# Patient Record
Sex: Male | Born: 1965 | Race: Black or African American | Hispanic: No | Marital: Married | Smoking: Former smoker
Health system: Southern US, Community
[De-identification: ages and names within clinical notes are randomized; demographics above are authoritative.]

## PROBLEM LIST (undated history)

## (undated) DIAGNOSIS — C801 Malignant (primary) neoplasm, unspecified: Secondary | ICD-10-CM

## (undated) DIAGNOSIS — A048 Other specified bacterial intestinal infections: Secondary | ICD-10-CM

## (undated) DIAGNOSIS — G709 Myoneural disorder, unspecified: Secondary | ICD-10-CM

## (undated) DIAGNOSIS — K219 Gastro-esophageal reflux disease without esophagitis: Secondary | ICD-10-CM

## (undated) DIAGNOSIS — E049 Nontoxic goiter, unspecified: Secondary | ICD-10-CM

## (undated) DIAGNOSIS — E785 Hyperlipidemia, unspecified: Secondary | ICD-10-CM

## (undated) DIAGNOSIS — R51 Headache: Secondary | ICD-10-CM

## (undated) HISTORY — DX: Gastro-esophageal reflux disease without esophagitis: K21.9

## (undated) HISTORY — DX: Hyperlipidemia, unspecified: E78.5

## (undated) HISTORY — DX: Nontoxic goiter, unspecified: E04.9

## (undated) HISTORY — DX: Other specified bacterial intestinal infections: A04.8

## (undated) HISTORY — PX: TUMOR REMOVAL: SHX12

## (undated) HISTORY — PX: GANGLION CYST EXCISION: SHX1691

## (undated) HISTORY — DX: Myoneural disorder, unspecified: G70.9

---

## 1999-01-07 ENCOUNTER — Ambulatory Visit (HOSPITAL_COMMUNITY): Admission: RE | Admit: 1999-01-07 | Discharge: 1999-01-07 | Payer: Self-pay | Admitting: General Surgery

## 1999-02-12 ENCOUNTER — Ambulatory Visit (HOSPITAL_COMMUNITY): Admission: RE | Admit: 1999-02-12 | Discharge: 1999-02-12 | Payer: Self-pay | Admitting: General Surgery

## 1999-02-12 ENCOUNTER — Emergency Department (HOSPITAL_COMMUNITY): Admission: EM | Admit: 1999-02-12 | Discharge: 1999-02-12 | Payer: Self-pay | Admitting: Emergency Medicine

## 1999-07-29 HISTORY — PX: HERNIA REPAIR: SHX51

## 2005-03-09 ENCOUNTER — Inpatient Hospital Stay (HOSPITAL_COMMUNITY): Admission: EM | Admit: 2005-03-09 | Discharge: 2005-03-11 | Payer: Self-pay | Admitting: Emergency Medicine

## 2005-03-09 ENCOUNTER — Ambulatory Visit: Payer: Self-pay | Admitting: Internal Medicine

## 2008-04-24 ENCOUNTER — Encounter (HOSPITAL_COMMUNITY): Admission: RE | Admit: 2008-04-24 | Discharge: 2008-07-05 | Payer: Self-pay | Admitting: Internal Medicine

## 2008-06-16 ENCOUNTER — Encounter: Admission: RE | Admit: 2008-06-16 | Discharge: 2008-06-16 | Payer: Self-pay | Admitting: Neurological Surgery

## 2010-12-13 NOTE — Discharge Summary (Signed)
NAMEJORIEL, STREETY NO.:  [id number]   MEDICAL RECORD NO.:  [id number]          PATIENT TYPE:  INP   LOCATION:  5709                         FACILITY:  [facility]   PHYSICIAN:  Ileana Roup, M.D.  DATE OF BIRTH:  September 11, 1965   DATE OF ADMISSION:  03/09/2005  DATE OF DISCHARGE:  03/11/2005                                 DISCHARGE SUMMARY   DISCHARGE DIAGNOSES:  1.  Hypotension.  2.  Fevers, chills, elevated white count.  3.  Weight loss and lymphadenopathy.  4.  Hematuria.  5.  Hypokalemia.  6.  Sinusitis.  7.  History of malaria 10 years ago, treated.  8.  Right inguinal hernia repair in 2000.   DISCHARGE MEDICATIONS:  1.  Doxycycline 100 mg b.i.d., stop on March 18, 2005.  2.  Claritin-D over the counter as needed.   CONDITION ON DISCHARGE:  Fair.   FOLLOWUP INSTRUCTIONS:  The patient has a followup appointment scheduled  with Dr. Orma Flaming at the [facility] and we will call him  with an appointment.   Other discharge instructions include for the patient to take his temperature  3 times a day and if above 101, call the outpatient clinic.  He is also  instructed to drink lots of fluids and to rest until his followup clinic  appointment.  He was given a note to excuse him from work until at least  March 17, 2005.   PROCEDURES:  The patient had a chest x-ray on March 09, 2005 that showed no  active cardiopulmonary disease.   He had a CT of the abdomen and pelvis on March 09, 2005 which showed no  abnormalities, a negative CT of both the abdomen and pelvis, no evidence of  any kidney stones.   He had a CT of the sinuses on March 10, 2005 that showed mucoperiosteal  thickening and polyps or mucous retention cysts in both maxillary sinuses as  well as extensive mucoperiosteal thickening of the ethmoid sinuses. Frontal  sinuses are clear.   A repeat chest x-ray on March 10, 2005 again showed no active  cardiopulmonary disease.   BRIEF ADMITTING HISTORY AND PHYSICAL:  Mr. Kucinski is a 45 year old  previously healthy male from the [place] who initially presented to the urgent  care on the morning of admission with symptoms that started that very  morning including headache, back pain, neck pain, fevers and chills as well  as dysuria and a cough with some joint pain and some sore throat.  He also  had a decrease in his appetite and decreased p.o. intake, but denied nausea,  vomiting, diarrhea or abdominal pain.  No dysphagia, no rashes, no wounds,  no sick contacts, no history of tick bites.  At the urgent care, his  temperature ranged from 101.7 to 104.2, his blood pressure was 93/60, white  blood cell count was 11.9.  He was also orthostatic with blood pressure  standing 86/68.  He was sent to the ED for further evaluation and in the  emergency room, he got over 2 L of normal saline bolus; however, his blood  pressure remained with systolics in the 80s and 90s; at that point, he was  admitted to the hospital.   On admission, he had a CBC that showed a white blood cell count of 7.7,  hemoglobin 13.9, hematocrit 40.1, platelets 177, 82% neutrophils; his ANC  was 6.3.  A comprehensive metabolic panel showed a sodium of 138, potassium  3.4, chloride 106, bicarb 26, BUN 9, creatinine 1.0, glucose 102, total  bilirubin 1.2, alkaline phosphatase 50, AST 16, ALT 15, total protein 6.4,  albumin 3.8, calcium 8.4.  A UA was positive only for moderate blood with  urine microscope showing 7-10 red blood cells per high-power field.  His  coagulations were normal with a PT of 13.9, PTT of 35 and on admission, his  vitals were temperature 100.9, blood pressure 81/59, pulse 97, respirations  20, saturating 100% on room air.   HOSPITAL COURSE:  PROBLEM #1 - FEVERS, CHILLS, LEUKOCYTOSIS, HYPOTENSION AND  HEADACHE:  The patient had a complex of symptoms that were concerning for  many things including sepsis, meningitis, other bacterial  infections.  Chest  x-ray was negative.  Blood cultures were negative x2.  Rapid Strep and  throat cultures were negative.  He was started on empiric Zosyn to cover  Strep and possible pneumonia as well as doxycycline to cover any tick-borne  diseases; these antibiotics were started in the emergency room prior to the  above-mentioned labs and cultures.  He was hydrated with normal saline bolus  and then maintained on 150 mL/hr.  His white count continued to trend down  and we began to look for other causes of his possible infection.  Other  studies that were done included a CT of the sinus suggesting possible  sinusitis.  HIV test was negative.  EBV showed past infection.  Malaria  smear was negative.  At the time of discharge, the only pending lab is Chad  Nile antigen and antibody.  The patient continued to improve in terms of his  white count, his fevers, his overall energy level and on the day of  discharge, his IV fluids were discontinued and he maintained good p.o.  intake with his systolic blood pressures being in the low 100s at the time  of discharge.  We still do not have a clear etiology of the fevers, chills,  leukocytosis and hypotension that the patient experienced; it likely could  have been a viral infection.  Of note, on initial presentation in the  emergency room, the patient's neurologic status was totally normal, he had  no signs of meningeal irritation, negative Brudzinski's and Kernig's sign,  and it was not felt that a lumbar puncture was warranted at that time, or  throughout the hospitalization.  At discharge, the patient was instructed to  continue taking doxycycline to cover questionable sinusitis as well as any  tick-borne illnesses for a total course of 10 days.  He was instructed to  take his temperature at least 3 times a day and call the clinic if his  temperature is above 101 or if he felt ill at all.  PROBLEM #2 - WEIGHT LOSS:  The patient reported a 20  pounds weight loss over  the past 2 months; he attributes this to his wife being out of town and him  not eating well and not taking care of himself.  Again, many tests were  ordered to evaluate the possibility of chronic disease including EBV,  malaria and West Nile; all were negative for  acute disease and the Chad Nile  is still pending at the time of discharge.  This should be followed up on as  an outpatient for any further weight loss.   PROBLEM #3 - HEMATURIA:  The patient came in with microscopic hematuria on  UA.  Urine cultures are still pending.  He did report 1 episode of dysuria  over the past week.  GC and Chlamydia tests are pending at the time of  discharge; however, the patient was asymptomatic at the time of discharge  and this can be followed up as an outpatient.   PROBLEM #4 - HYPOKALEMIA:  The patient came in hypokalemic; this was  repleted and followed, and at the time of discharge, potassium was stable.   DISCHARGE LABORATORY DATA AND VITALS:  At the time of discharge, the  patient's temperature was 98.3, pulse 64, respirations 16, blood pressure of  105/65, 100% on room air.   His last CBC showed a white blood cell count of 5.2, hemoglobin 14.7,  hematocrit 42.3, platelet count 155,000, percent neutrophils 62%,  lymphocytes 26, his ANC was 3.2.  Sed rate was normal at 4.  His last  chemistries showed a sodium of 137, potassium 3.6, chloride 104, bicarb 27,  BUN 3, creatinine 1.0, glucose 101, calcium 8.8.  CK normal at 119.   Labs pending at the time of discharge include GC, Chlamydia, West Nile,  urine culture.      Inis Sizer, M.D.    ______________________________  Ileana Roup, M.D.    DC/MEDQ  D:  03/12/2005  T:  03/12/2005  Job:  [id number]   cc:   [facility]

## 2011-10-24 ENCOUNTER — Ambulatory Visit (INDEPENDENT_AMBULATORY_CARE_PROVIDER_SITE_OTHER): Payer: BC Managed Care – PPO | Admitting: Internal Medicine

## 2011-10-24 VITALS — BP 94/56 | HR 71 | Temp 98.1°F | Resp 16 | Ht 72.75 in | Wt 171.6 lb

## 2011-10-24 DIAGNOSIS — R509 Fever, unspecified: Secondary | ICD-10-CM

## 2011-10-24 DIAGNOSIS — J019 Acute sinusitis, unspecified: Secondary | ICD-10-CM

## 2011-10-24 DIAGNOSIS — R05 Cough: Secondary | ICD-10-CM

## 2011-10-24 LAB — POCT CBC
Granulocyte percent: 46.5 %G (ref 37–80)
HCT, POC: 40.7 % — AB (ref 43.5–53.7)
Hemoglobin: 14.2 g/dL (ref 14.1–18.1)
MCH, POC: 30.9 pg (ref 27–31.2)
MCV: 88.6 fL (ref 80–97)
POC LYMPH PERCENT: 45.6 %L (ref 10–50)
Platelet Count, POC: 138 10*3/uL — AB (ref 142–424)
RBC: 4.59 M/uL — AB (ref 4.69–6.13)
RDW, POC: 13.3 %

## 2011-10-24 MED ORDER — IPRATROPIUM BROMIDE 0.06 % NA SOLN: 2.0000 | Freq: Four times a day (QID) | NASAL | Status: DC

## 2011-10-24 MED ORDER — CEFDINIR 300 MG PO CAPS: 300.0000 mg | ORAL_CAPSULE | Freq: Two times a day (BID) | ORAL | Status: AC

## 2011-10-24 NOTE — Patient Instructions (Signed)
You are being treated for a sinus infection and eustachian tube dysfunction.  Take your antibiotic and use the nasal spray as needed to help relieve your sinus pressure.  Return if not better in 3-4 days or sooner if your symptoms worsen.  Sinusitis Sinuses are air pockets within the bones of your face. The growth of bacteria within a sinus leads to infection. The infection prevents the sinuses from draining. This infection is called sinusitis. SYMPTOMS  There will be different areas of pain depending on which sinuses have become infected.  The maxillary sinuses often produce pain beneath the eyes.   Frontal sinusitis may cause pain in the middle of the forehead and above the eyes.  Other problems (symptoms) include:  Toothaches.   Colored, pus-like (purulent) drainage from the nose.   Swelling, warmth, and tenderness over the sinus areas may be signs of infection.  TREATMENT  Sinusitis is most often determined by an exam.X-rays may be taken. If x-rays have been taken, make sure you obtain your results or find out how you are to obtain them. Your caregiver may give you medications (antibiotics). These are medications that will help kill the bacteria causing the infection. You may also be given a medication (decongestant) that helps to reduce sinus swelling.  HOME CARE INSTRUCTIONS   Only take over-the-counter or prescription medicines for pain, discomfort, or fever as directed by your caregiver.   Drink extra fluids. Fluids help thin the mucus so your sinuses can drain more easily.   Applying either moist heat or ice packs to the sinus areas may help relieve discomfort.   Use saline nasal sprays to help moisten your sinuses. The sprays can be found at your local drugstore.  SEEK IMMEDIATE MEDICAL CARE IF:  You have a fever.   You have increasing pain, severe headaches, or toothache.   You have nausea, vomiting, or drowsiness.   You develop unusual swelling around the face or  trouble seeing.  MAKE SURE YOU:   Understand these instructions.   Will watch your condition.   Will get help right away if you are not doing well or get worse.  Document Released: 07/14/2005 Document Revised: 07/03/2011 Document Reviewed: 02/10/2007 [facility] Patient Information 2012 [place], [place].

## 2011-10-24 NOTE — Progress Notes (Signed)
  Subjective:    Patient ID: Joel Mason, male    DOB: 1966-04-11, 46 y.o.   MRN: [id number]  Fever  This is a new problem. The current episode started in the past 7 days. The problem occurs intermittently. The problem has been gradually improving. Associated symptoms include congestion, ear pain, muscle aches and vomiting. Pertinent negatives include no abdominal pain, chest pain, coughing, diarrhea, headaches or nausea. He has tried nothing for the symptoms.  Mr. Paolini is here with his wife today complaining of the flu for 5 days.  He had one episode of vomiting three days ago, has felt a bit feverish, and complains of a soreness inside his nose and mouth.  He has had no vomiting in the last 2 days, no diarrhea or other GI changes.  He does not have a sore throat or cough.  He works 12 hour shifts in a [employer].  He felt a bit dizzy today which lasted a few seconds when he moved his head quickly.    Review of Systems  Constitutional: Positive for fever.  HENT: Positive for ear pain and congestion.   Respiratory: Negative for cough.   Cardiovascular: Negative for chest pain.  Gastrointestinal: Positive for vomiting. Negative for nausea, abdominal pain and diarrhea.  Neurological: Negative for headaches.       Objective:   Physical Exam  Vitals reviewed. Constitutional: He is oriented to person, place, and time. He appears well-developed and well-nourished.  HENT:  Head: Normocephalic.  Left Ear: External ear normal.       Right IAC full of cerumen Left TM with air fluid levels seen, yellow and dull Small red papular lesion on left nares c/w herpetic lesion  Neck: Neck supple.  Cardiovascular: Normal rate and normal heart sounds.   Pulmonary/Chest: Effort normal and breath sounds normal. No respiratory distress. He has no wheezes. He has no rales.  Abdominal: Soft. He exhibits no distension. There is no tenderness. There is no rebound.  Musculoskeletal:  Normal range of motion.  Lymphadenopathy:    He has no cervical adenopathy.  Neurological: He is alert and oriented to person, place, and time.  Skin: Skin is warm and dry.  Psychiatric: He has a normal mood and affect. His behavior is normal.          Assessment & Plan:  Early sinusitis with ETD.  Cefdinir and Atrovent NS prescribed.  Pt is to stay well hydrated and keep his head propped on two pillows for the next few nights.  Given Duke's Magic Mouthwash for somatic comfort.  RTC if not improved in 3-2 days or if symptoms worsen.  OOW note until Monday, April 1st.

## 2012-04-24 ENCOUNTER — Ambulatory Visit (INDEPENDENT_AMBULATORY_CARE_PROVIDER_SITE_OTHER): Payer: BC Managed Care – PPO | Admitting: Family Medicine

## 2012-04-24 VITALS — BP 102/66 | HR 72 | Temp 97.8°F | Resp 16 | Ht 73.0 in | Wt 176.0 lb

## 2012-04-24 DIAGNOSIS — Z8639 Personal history of other endocrine, nutritional and metabolic disease: Secondary | ICD-10-CM

## 2012-04-24 DIAGNOSIS — Z23 Encounter for immunization: Secondary | ICD-10-CM

## 2012-04-24 DIAGNOSIS — R079 Chest pain, unspecified: Secondary | ICD-10-CM

## 2012-04-24 DIAGNOSIS — E049 Nontoxic goiter, unspecified: Secondary | ICD-10-CM

## 2012-04-24 DIAGNOSIS — R071 Chest pain on breathing: Secondary | ICD-10-CM

## 2012-04-24 DIAGNOSIS — K219 Gastro-esophageal reflux disease without esophagitis: Secondary | ICD-10-CM

## 2012-04-24 LAB — COMPREHENSIVE METABOLIC PANEL
ALT: 15 U/L (ref 0–53)
AST: 15 U/L (ref 0–37)
Albumin: 4.6 g/dL (ref 3.5–5.2)
CO2: 29 mEq/L (ref 19–32)
Calcium: 9.5 mg/dL (ref 8.4–10.5)
Total Protein: 7.1 g/dL (ref 6.0–8.3)

## 2012-04-24 LAB — POCT CBC
Granulocyte percent: 53.3 %G (ref 37–80)
Hemoglobin: 15.1 g/dL (ref 14.1–18.1)
Lymph, poc: 3.2 (ref 0.6–3.4)
MCHC: 33.6 g/dL (ref 31.8–35.4)
MID (cbc): 0.5 (ref 0–0.9)
POC Granulocyte: 4.2 (ref 2–6.9)
Platelet Count, POC: 231 10*3/uL (ref 142–424)
RBC: 4.85 M/uL (ref 4.69–6.13)
WBC: 7.9 10*3/uL (ref 4.6–10.2)

## 2012-04-24 LAB — LIPID PANEL
Cholesterol: 175 mg/dL (ref 0–200)
HDL: 43 mg/dL (ref >39)
LDL Cholesterol: 108 mg/dL — ABNORMAL HIGH (ref 0–99)
VLDL: 24 mg/dL (ref 0–40)

## 2012-04-24 LAB — POCT GLYCOSYLATED HEMOGLOBIN (HGB A1C): Hemoglobin A1C: 5.3

## 2012-04-24 MED ORDER — OMEPRAZOLE 40 MG PO CPDR: 40.0000 mg | DELAYED_RELEASE_CAPSULE | Freq: Every day | ORAL | Status: DC

## 2012-04-24 NOTE — Progress Notes (Signed)
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 His company insurance program has a review of his results from last year his testings that he got an in-house. His cholesterol was, and he was at risk of heart trouble and diabetes. He decided it was time to go and get checked out. Earlier this summer he had some episodes of chest pain. It was from his epigastrium to his upper chest and diffusely across the chest wall. It is a steady deep pain that lasted up to 8 hours at the longest episode. At times it took his breath and he broke out with a sweat throat. No radiation of the pain. X-rays suggest he try some Zantac, which gave possibly some relief. Otherwise has been pretty healthy. It does not get regular exercise. He is from [place], married.  Objective: Healthy-appearing male in no acute distress. Throat clear. Neck supple without nodes. He does have diffuse thyromegaly with a moderately large quarter. Chest is clear. Heart roots any murmurs. Abdomen soft and nontender. Extremities unremarkable without edema.  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  Plan: EKG, complete metabolic panel, lipids, CBC, hemoglobin A1c. TSH.  Results for orders placed in visit on 04/24/12  POCT CBC      Component Value Range   WBC 7.9  4.6 - 10.2 K/uL   Lymph, poc 3.2  0.6 - 3.4   POC LYMPH PERCENT 40.1  10 - 50 %L   MID (cbc) 0.5  0 - 0.9   POC MID % 6.6  0 - 12 %M   POC Granulocyte 4.2  2 - 6.9   Granulocyte percent 53.3  37 - 80 %G   RBC 4.85  4.69 - 6.13 M/uL   Hemoglobin 15.1  14.1 - 18.1 g/dL   HCT, POC 19.1  47.8 - 53.7 %   MCV 92.7  80 - 97 fL   MCH, POC 31.1  27 - 31.2 pg   MCHC 33.6  31.8 - 35.4 g/dL   RDW, POC 29.5     Platelet Count, POC 231  142 - 424 K/uL   MPV 10.8  0 - 99.8 fL  POCT  GLYCOSYLATED HEMOGLOBIN (HGB A1C)      Component Value Range   Hemoglobin A1C 5.3     Try omeprazole.  RTC if not better  EKG normal

## 2012-04-24 NOTE — Patient Instructions (Addendum)
I will send you the remainder of your labs in a few days. You are not diabetic.  Take the omeprazole one daily for stomach acid reduction  Return if more pain

## 2012-10-26 ENCOUNTER — Ambulatory Visit (INDEPENDENT_AMBULATORY_CARE_PROVIDER_SITE_OTHER): Payer: BC Managed Care – PPO | Admitting: Family Medicine

## 2012-10-26 VITALS — BP 110/70 | HR 60 | Temp 97.7°F | Resp 16 | Ht 73.0 in | Wt 184.0 lb

## 2012-10-26 DIAGNOSIS — B351 Tinea unguium: Secondary | ICD-10-CM

## 2012-10-26 DIAGNOSIS — M79609 Pain in unspecified limb: Secondary | ICD-10-CM

## 2012-10-26 DIAGNOSIS — B353 Tinea pedis: Secondary | ICD-10-CM

## 2012-10-26 MED ORDER — TERBINAFINE HCL 250 MG PO TABS: 250.0000 mg | ORAL_TABLET | Freq: Every day | ORAL | Status: DC

## 2012-10-26 NOTE — Patient Instructions (Addendum)
Use clotrimazole (Lotrimin) cream between toes daily  Take Terbinafine (lamisil) one daily for 30 days, then return for labs and we will give 60 days more if labs are good.

## 2012-10-26 NOTE — Progress Notes (Signed)
Subjective: 47 year old man with painful feet. He has been having problems with inflammation between his fourth and fifth toes. He's tried various OTC and home red extremities. He's used vinegar and an ointment that he bought over-the-counter, but these did not solve the problem. He finds if he is having to walk on the opposite side of the foot to try and keep from pressuring that area. He does have thickened large toenails on both feet also, which gives him troubles. He has to wear [employer] type boots all day, and is on his feet for 12 hour shifts. Otherwise is healthy.  Objective: No acute distress. Has fungal appearing rash between the fourth and fifth toes of both feet. There is probably a little starting to develop between the third and fourth toe. She otherwise look unremarkable except for the onychomycosis of the large toenails.  Assessment: Tinea pedis Onychomycosis  Plan: Lamisil.  Use topical lotrimin.

## 2013-02-14 ENCOUNTER — Ambulatory Visit (INDEPENDENT_AMBULATORY_CARE_PROVIDER_SITE_OTHER): Payer: BC Managed Care – PPO | Admitting: Family Medicine

## 2013-02-14 VITALS — BP 112/60 | HR 53 | Temp 97.7°F | Resp 16 | Ht 74.0 in | Wt 179.0 lb

## 2013-02-14 DIAGNOSIS — R1013 Epigastric pain: Secondary | ICD-10-CM

## 2013-02-14 DIAGNOSIS — K219 Gastro-esophageal reflux disease without esophagitis: Secondary | ICD-10-CM

## 2013-02-14 LAB — POCT CBC
HCT, POC: 43.4 % — AB (ref 43.5–53.7)
MCH, POC: 30.3 pg (ref 27–31.2)
MCHC: 32.7 g/dL (ref 31.8–35.4)
MID (cbc): 0.5 (ref 0–0.9)
POC MID %: 7.4 %M (ref 0–12)
Platelet Count, POC: 208 10*3/uL (ref 142–424)
WBC: 6.3 10*3/uL (ref 4.6–10.2)

## 2013-02-14 LAB — COMPREHENSIVE METABOLIC PANEL
Alkaline Phosphatase: 68 U/L (ref 39–117)
BUN: 20 mg/dL (ref 6–23)
CO2: 27 mEq/L (ref 19–32)
Calcium: 9.7 mg/dL (ref 8.4–10.5)
Total Bilirubin: 1.4 mg/dL — ABNORMAL HIGH (ref 0.3–1.2)
Total Protein: 7.6 g/dL (ref 6.0–8.3)

## 2013-02-14 MED ORDER — OMEPRAZOLE 40 MG PO CPDR: 40.0000 mg | DELAYED_RELEASE_CAPSULE | Freq: Every day | ORAL | Status: DC

## 2013-02-14 MED ORDER — SUCRALFATE 1 G PO TABS: 1.0000 g | ORAL_TABLET | Freq: Four times a day (QID) | ORAL | Status: DC

## 2013-02-14 NOTE — Patient Instructions (Addendum)
We will be in touch with your labs as soon as they come in.  I will also get you referred to GI.  If you get worse or have any change in your symptoms in the meantime please let me konw

## 2013-02-14 NOTE — Progress Notes (Addendum)
[facility] [address], [place] [address] [phone]  Date:  02/14/2013   Name:  Joel Mason   DOB:  October 18, 1965   MRN:  [id number]  PCP:  Tally Due, MD    Chief Complaint: Abdominal Pain   History of Present Illness:  Christophere Mason is a 47 y.o. very pleasant male patient who presents with the following:  He has noted some pain in his epigastric area, and in his left "between the ribs" area.  He has had this in the past.  He has been dx with reflex when he had this problem before.    He notes that he has had this for the last 4 years, and may occur 3 or 4 times a year. Usually lying down for a couple of hours will help. If he is active the pain will persist.   The pain came back on Saturday- 2 days ago.  He was at work but came home early to rest. The pain lasted until around 6pm.  Yesterday he felt "sore" in the epigastric area.  He would have pain if he pressed on the epigastric area.  Right now he feels "burning" in the epigastric area.  He is fasting right now for Ramadan- he last ate at 0200 this am.    He was given prilosec in the past- he takes prilosec and or zantac on occasion but has not had any recently.  He is using "cold milk" to help with his sx right now.   He last had the pain in his chest yesteday- but still feels a little burning in his chest now.    He used to use ibuprofen, but quit using it a few months ago due to increased GERD.   No history of cardiac problems   Patient Active Problem List   Diagnosis Date Noted  . Hx of hyperglycemia 04/24/2012  . 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 Hx of hyperlipidemia 04/24/2012  . Chest pain 04/24/2012    History reviewed. No pertinent past medical history.  Past Surgical History  Procedure Laterality Date  . Hernia repair      History  Substance Use Topics  . Smoking status: Former Smoker    Quit date: 10/24/2003  . Smokeless tobacco: Not on file  . Alcohol Use: No     Family History  Problem Relation Age of Onset  . Diabetes Mother     No Known Allergies  Medication list has been reviewed and updated.  Current Outpatient Prescriptions on File Prior to Visit  Medication Sig Dispense Refill  . ibuprofen (ADVIL,MOTRIN) 200 MG tablet Take 400 mg by mouth at bedtime.      Marland Kitchen ipratropium (ATROVENT) 0.06 % nasal spray Place 2 sprays into the nose 4 (four) times daily.  15 mL  1  . omeprazole (PRILOSEC) 40 MG capsule Take 1 capsule (40 mg total) by mouth daily.  30 capsule  3  . ranitidine (ZANTAC) 300 MG tablet Take 300 mg by mouth as needed.      . terbinafine (LAMISIL) 250 MG tablet Take 1 tablet (250 mg total) by mouth daily.  30 tablet  0   No current facility-administered medications on file prior to visit.    Review of Systems:  As per HPI- otherwise negative.   Physical Examination: Filed Vitals:   02/14/13 1549  BP: 112/60  Pulse: 53  Temp: 97.7 F (36.5 C)  Resp: 16   Filed Vitals:   02/14/13  1549  Height: 6\' 2"  (1.88 m)  Weight: 179 lb (81.194 kg)   Body mass index is 22.97 kg/(m^2). Ideal Body Weight: Weight in (lb) to have BMI = 25: 194.3  GEN: WDWN, NAD, Non-toxic, A & O x 3, looks well HEENT: Atraumatic, Normocephalic. Neck supple. No masses, No LAD. Ears and Nose: No external deformity. CV: RRR, No M/G/R. No JVD. No thrill. No extra heart sounds. PULM: CTA B, no wheezes, crackles, rhonchi. No retractions. No resp. distress. No accessory muscle use. ABD: S, NT, ND, +BS. No rebound. No HSM.  Indicates the epigastric area as the site of his pain EXTR: No c/c/e NEURO Normal gait.  PSYCH: Normally interactive. Conversant. Not depressed or anxious appearing.  Calm demeanor.   Given a GI cocktail: this resolved the burning feeling in his stomach  Results for orders placed in visit on 02/14/13  POCT CBC      Result Value Range   WBC 6.3  4.6 - 10.2 K/uL   Lymph, poc 2.4  0.6 - 3.4   POC LYMPH PERCENT 38.7  10 - 50 %L    MID (cbc) 0.5  0 - 0.9   POC MID % 7.4  0 - 12 %M   POC Granulocyte 3.4  2 - 6.9   Granulocyte percent 53.9  37 - 80 %G   RBC 4.69  4.69 - 6.13 M/uL   Hemoglobin 14.2  14.1 - 18.1 g/dL   HCT, POC 16.1 (*) 09.6 - 53.7 %   MCV 92.5  80 - 97 fL   MCH, POC 30.3  27 - 31.2 pg   MCHC 32.7  31.8 - 35.4 g/dL   RDW, POC 04.5     Platelet Count, POC 208  142 - 424 K/uL   MPV 11.3  0 - 99.8 fL     EKG: NSR, mild bradycardia.  No ST elevation or depression.  Compared to past EKG and did not note any significant change Assessment and Plan: GERD (gastroesophageal reflux disease) - Plan: POCT CBC, Comprehensive metabolic panel, omeprazole (PRILOSEC) 40 MG capsule, sucralfate (CARAFATE) 1 G tablet, Ambulatory referral to Gastroenterology  Abdominal pain, epigastric - Plan: EKG 12-Lead  Treat with a PPI and carafate.  Plan referral to GI as he is quite concerned about the possibility of an ulcer or stomach cancer.  If he is not better of if worse please let me know  Signed Abbe Amsterdam, MD  Called 7/23 regarding his labs: [facility] that labs look ok, will send a copy Results for orders placed in visit on 02/14/13  COMPREHENSIVE METABOLIC PANEL      Result Value Range   Sodium 137  135 - 145 mEq/L   Potassium 3.6  3.5 - 5.3 mEq/L   Chloride 101  96 - 112 mEq/L   CO2 27  19 - 32 mEq/L   Glucose, Bld 91  70 - 99 mg/dL   BUN 20  6 - 23 mg/dL   Creat 4.09  8.11 - 9.14 mg/dL   Total Bilirubin 1.4 (*) 0.3 - 1.2 mg/dL   Alkaline Phosphatase 68  39 - 117 U/L   AST 18  0 - 37 U/L   ALT 22  0 - 53 U/L   Total Protein 7.6  6.0 - 8.3 g/dL   Albumin 4.7  3.5 - 5.2 g/dL   Calcium 9.7  8.4 - 78.2 mg/dL  POCT CBC      Result Value Range   WBC 6.3  4.6 - 10.2 K/uL   Lymph, poc 2.4  0.6 - 3.4   POC LYMPH PERCENT 38.7  10 - 50 %L   MID (cbc) 0.5  0 - 0.9   POC MID % 7.4  0 - 12 %M   POC Granulocyte 3.4  2 - 6.9   Granulocyte percent 53.9  37 - 80 %G   RBC 4.69  4.69 - 6.13 M/uL   Hemoglobin 14.2   14.1 - 18.1 g/dL   HCT, POC 30.8 (*) 65.7 - 53.7 %   MCV 92.5  80 - 97 fL   MCH, POC 30.3  27 - 31.2 pg   MCHC 32.7  31.8 - 35.4 g/dL   RDW, POC 84.6     Platelet Count, POC 208  142 - 424 K/uL   MPV 11.3  0 - 99.8 fL

## 2013-02-16 ENCOUNTER — Encounter: Payer: Self-pay | Admitting: Gastroenterology

## 2013-02-16 ENCOUNTER — Encounter: Payer: Self-pay | Admitting: Family Medicine

## 2013-02-18 ENCOUNTER — Encounter: Payer: Self-pay | Admitting: *Deleted

## 2013-03-01 ENCOUNTER — Encounter: Payer: Self-pay | Admitting: Gastroenterology

## 2013-03-01 ENCOUNTER — Ambulatory Visit (INDEPENDENT_AMBULATORY_CARE_PROVIDER_SITE_OTHER): Payer: BC Managed Care – PPO | Admitting: Gastroenterology

## 2013-03-01 VITALS — BP 110/72 | HR 59 | Ht 74.0 in | Wt 181.9 lb

## 2013-03-01 DIAGNOSIS — K219 Gastro-esophageal reflux disease without esophagitis: Secondary | ICD-10-CM

## 2013-03-01 DIAGNOSIS — R1013 Epigastric pain: Secondary | ICD-10-CM

## 2013-03-01 MED ORDER — HYOSCYAMINE SULFATE 0.125 MG SL SUBL: 0.1250 mg | SUBLINGUAL_TABLET | SUBLINGUAL | Status: DC | PRN

## 2013-03-01 NOTE — Addendum Note (Signed)
Addended by: Ok Anis A on: 03/01/2013 12:12 PM   Modules accepted: Orders

## 2013-03-01 NOTE — Progress Notes (Signed)
History of Present Illness:  This is a 47 year old male patient who has had 5 years of recurrent episodes of rather sharp mid abdominal pain radiating into his back without real precipitating or alleviating elements.  The pain will last 6-10 hours in duration, but is not associated with nausea and vomiting, diarrhea, or any hepatobiliary or systemic complaints.  He does have some acid reflux symptoms, and is on omeprazole 20 mg a day with when necessary Cara fate.  His last" attack" was approximately 3 weeks ago.  During these episodes it does hurt to eat and food makes his pain worse, but he denies dysphagia or painful swallowing.  The patient also denies abuse of alcohol, cigarettes, or NSAIDs.  His had no anorexia, weight loss, melena or hematochezia.  Patient also has not had previous GI evaluations, x-rays, or endoscopic exams.  Family history is noncontributory.  I have reviewed this patient's present history, medical and surgical past history, allergies and medications.     ROS:   All systems were reviewed and are negative unless otherwise stated in the HPI.    Physical Exam: Blood pressure 112/60, pulse 53 and regular and weight 179 with a BMI of 22.97. General well developed well nourished patient in no acute distress, appearing their stated age Eyes PERRLA, no icterus, fundoscopic exam per opthamologist Skin no lesions noted Neck supple, no adenopathy, no thyroid enlargement, no tenderness Chest clear to percussion and auscultation Heart no significant murmurs, gallops or rubs noted Abdomen no hepatosplenomegaly masses or tenderness, BS normal.  Extremities no acute joint lesions, edema, phlebitis or evidence of cellulitis. Neurologic patient oriented x 3, cranial nerves intact, no focal neurologic deficits noted. Psychological mental status normal and normal affect.  Assessment and plan: This patient has chronic GERD and may be having episodes of secondary esophageal spasm, rule out  cholelithiasis.  I've scheduled him for upper abdominal ultrasound exam and endoscopic exam.  His continue his current medications with when necessary sublingual Levsin.  At the time of endoscopy, we will check him for H. pylori infection.  Review of his labs shows a normal CBC and liver profile and general metabolic profile.

## 2013-03-01 NOTE — Patient Instructions (Addendum)
You have been scheduled for an endoscopy with propofol. Please follow written instructions given to you at your visit today. If you use inhalers (even only as needed), please bring them with you on the day of your procedure. Your physician has requested that you go to www.startemmi.com and enter the access code given to you at your visit today. This web site gives a general overview about your procedure. However, you should still follow specific instructions given to you by our office regarding your preparation for the procedure.  You have been scheduled for an abdominal ultrasound at [facility] (1st floor of hospital) on 03-03-2013 at 830 am. Please arrive 15 minutes prior to your appointment for registration. Make certain not to have anything to eat or drink 6 hours prior to your appointment. Should you need to reschedule your appointment, please contact radiology at [phone]. This test typically takes about 30 minutes to perform.  We have sent the following medications to your pharmacy for you to pick up at your convenience: Levsin, please take as directed. __________________________________________                                               We are excited to introduce MyChart, a new best-in-class service that provides you online access to important information in your electronic medical record. We want to make it easier for you to view your health information - all in one secure location - when and where you need it. We expect MyChart will enhance the quality of care and service we provide.  When you register for MyChart, you can:    View your test results.    Request appointments and receive appointment reminders via email.    Request medication renewals.    View your medical history, allergies, medications and immunizations.    Communicate with your physician's office through a password-protected site.    Conveniently print information such as your medication  lists.  To find out if MyChart is right for you, please talk to a member of our clinical staff today. We will gladly answer your questions about this free health and wellness tool.  If you are age 42 or older and want a member of your family to have access to your record, you must provide written consent by completing a proxy form available at our office. Please speak to our clinical staff about guidelines regarding accounts for patients younger than age 38.  As you activate your MyChart account and need any technical assistance, please call the MyChart technical support line at [phone]) or email your question to [email or web address]. If you email your question(s), please include your name, a return phone number and the best time to reach you.  If you have non-urgent health-related questions, you can send a message to our office through MyChart at [email or web address]. If you have a medical emergency, call 911.  Thank you for using MyChart as your new health and wellness resource!   MyChart licensed from [employer],  [id number]. Patents Pending.

## 2013-03-02 ENCOUNTER — Encounter: Payer: Self-pay | Admitting: Gastroenterology

## 2013-03-03 ENCOUNTER — Ambulatory Visit (HOSPITAL_COMMUNITY)
Admission: RE | Admit: 2013-03-03 | Discharge: 2013-03-03 | Disposition: A | Discharge status: Home / Self Care | Payer: BC Managed Care – PPO | Source: Ambulatory Visit | Attending: Gastroenterology | Admitting: Gastroenterology

## 2013-03-03 DIAGNOSIS — R1013 Epigastric pain: Secondary | ICD-10-CM

## 2013-03-04 ENCOUNTER — Encounter: Payer: Self-pay | Admitting: *Deleted

## 2013-03-11 ENCOUNTER — Encounter: Payer: Self-pay | Admitting: Gastroenterology

## 2013-03-11 ENCOUNTER — Other Ambulatory Visit: Payer: Self-pay | Admitting: *Deleted

## 2013-03-11 ENCOUNTER — Ambulatory Visit (AMBULATORY_SURGERY_CENTER): Payer: BC Managed Care – PPO | Admitting: Gastroenterology

## 2013-03-11 ENCOUNTER — Telehealth: Payer: Self-pay | Admitting: *Deleted

## 2013-03-11 VITALS — BP 106/67 | HR 62 | Temp 98.5°F | Resp 28 | Ht 74.0 in | Wt 181.0 lb

## 2013-03-11 DIAGNOSIS — R079 Chest pain, unspecified: Secondary | ICD-10-CM

## 2013-03-11 DIAGNOSIS — K219 Gastro-esophageal reflux disease without esophagitis: Secondary | ICD-10-CM

## 2013-03-11 DIAGNOSIS — C7A010 Malignant carcinoid tumor of the duodenum: Secondary | ICD-10-CM

## 2013-03-11 DIAGNOSIS — R1013 Epigastric pain: Secondary | ICD-10-CM

## 2013-03-11 DIAGNOSIS — K209 Esophagitis, unspecified: Secondary | ICD-10-CM

## 2013-03-11 MED ORDER — SODIUM CHLORIDE 0.9 % IV SOLN: 500.0000 mL | INTRAVENOUS | Status: DC

## 2013-03-11 MED ORDER — HYOSCYAMINE SULFATE 0.125 MG SL SUBL: 0.1250 mg | SUBLINGUAL_TABLET | SUBLINGUAL | Status: DC | PRN

## 2013-03-11 NOTE — Patient Instructions (Addendum)
YOU HAD AN ENDOSCOPIC PROCEDURE TODAY AT THE [facility]: Refer to the procedure report that was given to you for any specific questions about what was found during the examination.  If the procedure report does not answer your questions, please call your gastroenterologist to clarify.  If you requested that your care partner not be given the details of your procedure findings, then the procedure report has been included in a sealed envelope for you to review at your convenience later.  YOU SHOULD EXPECT: Some feelings of bloating in the abdomen. Passage of more gas than usual.  Walking can help get rid of the air that was put into your GI tract during the procedure and reduce the bloating. If you had a lower endoscopy (such as a colonoscopy or flexible sigmoidoscopy) you may notice spotting of blood in your stool or on the toilet paper. If you underwent a bowel prep for your procedure, then you may not have a normal bowel movement for a few days.  DIET: Your first meal following the procedure should be a light meal and then it is ok to progress to your normal diet.  A half-sandwich or bowl of soup is an example of a good first meal.  Heavy or fried foods are harder to digest and may make you feel nauseous or bloated.  Likewise meals heavy in dairy and vegetables can cause extra gas to form and this can also increase the bloating.  Drink plenty of fluids but you should avoid alcoholic beverages for 24 hours.  ACTIVITY: Your care partner should take you home directly after the procedure.  You should plan to take it easy, moving slowly for the rest of the day.  You can resume normal activity the day after the procedure however you should NOT DRIVE or use heavy machinery for 24 hours (because of the sedation medicines used during the test).    SYMPTOMS TO REPORT IMMEDIATELY: A gastroenterologist can be reached at any hour.  During normal business hours, 8:30 AM to 5:00 PM Monday through Friday,  call [phone].  After hours and on weekends, please call the GI answering service at [phone] who will take a message and have the physician on call contact you.    Following upper endoscopy (EGD)  Vomiting of blood or coffee ground material  New chest pain or pain under the shoulder blades  Painful or persistently difficult swallowing  New shortness of breath  Fever of 100F or higher  Black, tarry-looking stools  FOLLOW UP: If any biopsies were taken you will be contacted by phone or by letter within the next 1-3 weeks.  Call your gastroenterologist if you have not heard about the biopsies in 3 weeks.  Our staff will call the home number listed on your records the next business day following your procedure to check on you and address any questions or concerns that you may have at that time regarding the information given to you following your procedure. This is a courtesy call and so if there is no answer at the home number and we have not heard from you through the emergency physician on call, we will assume that you have returned to your regular daily activities without incident.  SIGNATURES/CONFIDENTIALITY: You and/or your care partner have signed paperwork which will be entered into your electronic medical record.  These signatures attest to the fact that that the information above on your After Visit Summary has been reviewed and is understood.  Full   responsibility of the confidentiality of this discharge information lies with you and/or your care-partner.  Recommendations Await pathology results Continue current medications

## 2013-03-11 NOTE — Progress Notes (Signed)
Patient did not have preoperative order for IV antibiotic SSI prophylaxis. (G8918)  Patient did not experience any of the following events: a burn prior to discharge; a fall within the facility; wrong site/side/patient/procedure/implant event; or a hospital transfer or hospital admission upon discharge from the facility. (G8907)  

## 2013-03-11 NOTE — Telephone Encounter (Signed)
You have been scheduled for a HIDA scan at [facility] (1st floor) on 03-21-2013. Please arrive 15 minutes prior to your scheduled appointment on 1 pm. Make certain not to have anything to eat or drink at least 6 hours prior to your test. Should this appointment date or time not work well for you, please call radiology scheduling at [phone].  _____________________________________________________________________ hepatobiliary (HIDA) scan is an imaging procedure used to diagnose problems in the liver, gallbladder and bile ducts. In the HIDA scan, a radioactive chemical or tracer is injected into a vein in your arm. The tracer is handled by the liver like bile. Bile is a fluid produced and excreted by your liver that helps your digestive system break down fats in the foods you eat. Bile is stored in your gallbladder and the gallbladder releases the bile when you eat a meal. A special nuclear medicine scanner (gamma camera) tracks the flow of the tracer from your liver into your gallbladder and small intestine.  During your HIDA scan  You'll be asked to change into a hospital gown before your HIDA scan begins. Your health care team will position you on a table, usually on your back. The radioactive tracer is then injected into a vein in your arm.The tracer travels through your bloodstream to your liver, where it's taken up by the bile-producing cells. The radioactive tracer travels with the bile from your liver into your gallbladder and through your bile ducts to your small intestine.You may feel some pressure while the radioactive tracer is injected into your vein. As you lie on the table, a special gamma camera is positioned over your abdomen taking pictures of the tracer as it moves through your body. The gamma camera takes pictures continually for about an hour. You'll need to keep still during the HIDA scan. This can become uncomfortable, but you may find that you can lessen the discomfort by  taking deep breaths and thinking about other things. Tell your health care team if you're uncomfortable. The radiologist will watch on a computer the progress of the radioactive tracer through your body. The HIDA scan may be stopped when the radioactive tracer is seen in the gallbladder and enters your small intestine. This typically takes about an hour. In some cases extra imaging will be performed if original images aren't satisfactory, if morphine is given to help visualize the gallbladder or if the medication CCK is given to look at the contraction of the gallbladder. This test typically takes 2 hours to complete. __________________________________________________________________________________________________________________________________________________  Patient notified in office

## 2013-03-11 NOTE — Op Note (Addendum)
[facility] [address] [place], [address]   ENDOSCOPY PROCEDURE REPORT  PATIENT: Joel Mason, Joel Mason  MR#: [id number] BIRTHDATE: Nov 23, 1965 , 46  yrs. old GENDER: Male ENDOSCOPIST:Loyal Holzheimer Hale Bogus, MD, [facility] REFERRED BY: PROCEDURE DATE:  03/11/2013 PROCEDURE:   EGD w/ biopsy and EGD w/ biopsy for H.pylori ASA CLASS:    Class II INDICATIONS: Chest pain. MEDICATION: propofol (Diprivan) 150mg  IV TOPICAL ANESTHETIC:  DESCRIPTION OF PROCEDURE:   After the risks and benefits of the procedure were explained, informed consent was obtained.  The LB [id number]  endoscope was introduced through the mouth  and advanced to the second portion of the duodenum .  The instrument was slowly withdrawn as the mucosa was fully examined.      DUODENUM: The duodenal mucosa showed no abnormalities in the bulb and second portion of the duodenum. A small nodule in the bulb removed with several bites,size under 5 mm.  STOMACH: The mucosa of the stomach appeared normal. CLO Bx, done.  ESOPHAGUS: The mucosa of the esophagus appeared normal.  Multiple biopsies were performed.    Retroflexed views revealed no abnormalities.    The scope was then withdrawn from the patient and the procedure completed.  COMPLICATIONS: There were no complications.   ENDOSCOPIC IMPRESSION: The duodenal mucosa showed no abnormalities in the bulb and second portion of the duodenum  RECOMMENDATIONS: 1.  Await pathology results 2.  Continue current medications    _______________________________ eSigned:  Mardella Layman, MD, [facility] 03/16/2013 1:38 PM Revised: 03/16/2013 1:38 PM  antireflux

## 2013-03-11 NOTE — Progress Notes (Signed)
A/ox3 pleased with MAC, report to Wendy RN 

## 2013-03-11 NOTE — Progress Notes (Signed)
Called to room to assist during endoscopic procedure.  Patient ID and intended procedure confirmed with present staff. Received instructions for my participation in the procedure from the performing physician.  

## 2013-03-14 ENCOUNTER — Telehealth: Payer: Self-pay | Admitting: *Deleted

## 2013-03-14 LAB — HELICOBACTER PYLORI SCREEN-BIOPSY: UREASE: POSITIVE

## 2013-03-14 NOTE — Telephone Encounter (Signed)
  Follow up Call-  Call back number 03/11/2013  Post procedure Call Back phone  # [phone]  Permission to leave phone message Yes     Patient questions:  Do you have a fever, pain , or abdominal swelling? no Pain Score  0 *  Have you tolerated food without any problems? yes  Have you been able to return to your normal activities? yes  Do you have any questions about your discharge instructions: Diet   no Medications  no Follow up visit  no  Do you have questions or concerns about your Care? no  Actions: * If pain score is 4 or above: No action needed, pain <4.

## 2013-03-16 ENCOUNTER — Encounter: Payer: Self-pay | Admitting: Gastroenterology

## 2013-03-17 MED ORDER — AMOXICILL-CLARITHRO-LANSOPRAZ PO MISC: Freq: Two times a day (BID) | ORAL | Status: DC

## 2013-03-17 NOTE — Telephone Encounter (Signed)
Informed pt of + H. Pylori and the need for AB; ordered Prevpak. Pt stated understanding and will call if he needs alternative meds d/t the price.

## 2013-03-21 ENCOUNTER — Encounter (HOSPITAL_COMMUNITY)
Admission: RE | Admit: 2013-03-21 | Discharge: 2013-03-21 | Disposition: A | Discharge status: Home / Self Care | Payer: BC Managed Care – PPO | Source: Ambulatory Visit | Attending: Gastroenterology | Admitting: Gastroenterology

## 2013-03-21 DIAGNOSIS — K219 Gastro-esophageal reflux disease without esophagitis: Secondary | ICD-10-CM | POA: Insufficient documentation

## 2013-03-21 DIAGNOSIS — R079 Chest pain, unspecified: Secondary | ICD-10-CM

## 2013-03-21 MED ORDER — SINCALIDE 5 MCG IJ SOLR
0.0200 ug/kg | Freq: Once | INTRAMUSCULAR | Status: AC
  Administered 2013-03-21: 14:00:00 via INTRAVENOUS

## 2013-03-21 MED ORDER — TECHNETIUM TC 99M MEBROFENIN IV KIT
5.3000 | PACK | Freq: Once | INTRAVENOUS | Status: AC | PRN
  Administered 2013-03-21: 5 via INTRAVENOUS

## 2013-03-23 ENCOUNTER — Telehealth: Payer: Self-pay | Admitting: *Deleted

## 2013-03-23 DIAGNOSIS — R933 Abnormal findings on diagnostic imaging of other parts of digestive tract: Secondary | ICD-10-CM

## 2013-03-23 DIAGNOSIS — R948 Abnormal results of function studies of other organs and systems: Secondary | ICD-10-CM

## 2013-03-23 NOTE — Telephone Encounter (Signed)
Informed pt of abnormal GB and the need to see a surgeon, but Dr Jarold Motto wants him to complete the Prevpak and see him 1st. He states he gets paid this week and will have his meds filled; states he didn't get the hyoscyamine yet either. Informed him, it probably wouldn't help since we know it's his GB causing the problem. Called pt back to inform him his Co Pay is $5 for the generic PrevPak. He already has an appt on 04/08/13, so I asked him to let me know if he doesn't start the AB this week. Mailed him an appt reminder. Pt stated understanding.

## 2013-03-23 NOTE — Telephone Encounter (Signed)
Message copied by Florene Glen on Wed Mar 23, 2013  8:22 AM ------      Message from: [place], DAVID R      Created: Tue Mar 22, 2013  9:14 AM       Abnormal gallbladder scan he probably will need cholecystectomy.  But I would like to see him back in the office after treatment for H. pylori before proceeding with surgical referral.  Please schedule him to see me again in 2-3 weeks time if possible. Youcan tell him that his gallbladder is dysfunctional and probably will need to be removed depending on his clinical course ------

## 2013-03-30 ENCOUNTER — Telehealth: Payer: Self-pay | Admitting: Gastroenterology

## 2013-03-30 NOTE — Telephone Encounter (Signed)
Pt took 5 days of Prevpak with no problems and then developed "shaking at night" that woke him up; he also reports being dizzy. He states he only took it once yesterday with the same effects and has not it taken it today. He denies n/v and abdominal pain, his bowels are OK; just doesn't feel riight. Advised pt not to take anymore PrevPak and I will call him back. Dr Jarold Motto, OK to change to Pylera? Thanks.

## 2013-03-30 NOTE — Telephone Encounter (Signed)
Informed pt Dr Jarold Motto would like him seen; he will f/u tomorrow with Mike Gip, PA. I have sent Liborio Nixon at [facility] a request for a surgical appt for possible cholecystectomy per abnormal HIDA Scan.

## 2013-03-30 NOTE — Telephone Encounter (Signed)
PA follow up

## 2013-03-31 ENCOUNTER — Encounter: Payer: Self-pay | Admitting: Physician Assistant

## 2013-03-31 ENCOUNTER — Ambulatory Visit (INDEPENDENT_AMBULATORY_CARE_PROVIDER_SITE_OTHER): Payer: BC Managed Care – PPO | Admitting: Physician Assistant

## 2013-03-31 VITALS — BP 90/60 | HR 64 | Ht 74.0 in | Wt 181.0 lb

## 2013-03-31 DIAGNOSIS — T887XXA Unspecified adverse effect of drug or medicament, initial encounter: Secondary | ICD-10-CM

## 2013-03-31 DIAGNOSIS — K828 Other specified diseases of gallbladder: Secondary | ICD-10-CM

## 2013-03-31 DIAGNOSIS — T50905A Adverse effect of unspecified drugs, medicaments and biological substances, initial encounter: Secondary | ICD-10-CM

## 2013-03-31 DIAGNOSIS — R1013 Epigastric pain: Secondary | ICD-10-CM

## 2013-03-31 NOTE — Patient Instructions (Addendum)
Stop the medicines for H. Pylori. Keep the surgical appointment with Dr. Gaynelle Adu. Use Prilosec for heartburn and indigestion. Do not take medications with Clarithromycin and amoxicillan.  We made you a follow up with Dr. Jarold Motto for 05-26-2013 at 9:00 AM.

## 2013-03-31 NOTE — Progress Notes (Signed)
Subjective:    Patient ID: Joel Mason, male    DOB: 04/26/1966, 47 y.o.   MRN: [id number]  HPI  Joel Mason is a pleasant 47 year old male who has been undergoing evaluation by Dr. Jarold Motto for complaints of intermittent severe epigastric pain. He says he has been having 3 or 4 episodes per year and that these episodes cause intense epigastric pain that lasts for multiple hours. His last episode was in July and lasted for about 9 hours. He says this was not associated with nausea or vomiting chills fever or diarrhea. He has felt well since then, He underwent upper abdominal ultrasound which did not show any gallstones but did question possible minimal, tail artifact which could be associated with adenomyomatosis. He is also had subsequent CCK HIDA scan which showed a significantly decreased ejection fraction 3%. He also underwent EGD which showed a normal stomach lobe I biopsied was done as a small nodule in the duodenal bulb under 5 mm and removed with biopsies. H. pylori returned positive in the biopsies from the small duodenal nodule have returned showing a duodenal carcinoid. Patient was started on a Prevpac her treatment of the H. pylori and says he took this for about 5 days without any problems and then on the sixth day developed symptoms during the nighttime with a sensation of palpitations and a tremor is feeling in his extremities. He also had some dizziness. He skipped a dose the next day and then tried the medication again with similar symptoms and since stopped earlier this week. He says over the past 2 days he has not had any residual dizziness or tremor and feels fine he has not had any abdominal pain. His appetite is fine and he is eating well. He has a surgical appointment scheduled for next week with Dr. Andrey Campanile to discuss cholecystectomy   Review of Systems  Constitutional: Negative.   HENT: Negative.   Eyes: Negative.   Respiratory: Negative.   Cardiovascular: Positive  for palpitations.  Gastrointestinal: Positive for abdominal pain.  Endocrine: Negative.   Genitourinary: Negative.   Musculoskeletal: Negative.   Skin: Negative.   Allergic/Immunologic: Negative.   Neurological: Positive for dizziness and tremors.  Hematological: Negative.   Psychiatric/Behavioral: Negative.    Outpatient Prescriptions Prior to Visit  Medication Sig Dispense Refill  . hyoscyamine (LEVSIN SL) 0.125 MG SL tablet Place 1 tablet (0.125 mg total) under the tongue every 4 (four) hours as needed for cramping.  30 tablet  6  . ibuprofen (ADVIL,MOTRIN) 200 MG tablet Take 400 mg by mouth at bedtime.      Marland Kitchen ipratropium (ATROVENT) 0.06 % nasal spray Place 2 sprays into the nose 4 (four) times daily.  15 mL  1  . omeprazole (PRILOSEC) 40 MG capsule Take 1 capsule (40 mg total) by mouth daily.  30 capsule  3  . ranitidine (ZANTAC) 300 MG tablet Take 300 mg by mouth as needed.      . sucralfate (CARAFATE) 1 G tablet Take 1 tablet (1 g total) by mouth 4 (four) times daily. Take with meals and before bed  40 tablet  0  . amoxicillin-clarithromycin-lansoprazole (PREVPAC) combo pack Take by mouth 2 (two) times daily. Follow package directions.  1 kit  0   No facility-administered medications prior to visit.   No Known Allergies Patient Active Problem List   Diagnosis Date Noted  . Hx of hyperglycemia 04/24/2012  . 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 Hx of hyperlipidemia 04/24/2012  . Chest pain 04/24/2012  History  Substance Use Topics  . Smoking status: Former Smoker    Quit date: 10/24/2003  . Smokeless tobacco: Never Used  . Alcohol Use: No   family history includes Diabetes in his mother.     Objective:   Physical Exam  w soft nontender nondistended bowel sounds are active there is no palpable mass or hepatosplenomegaly ell-developed male in no acute distress, pleasant blood pressure 90/60 pulse 64 height 6 foot 2 weight 181. HEENT; nontraumatic normocephalic EOMI PERRLA sclera  anicteric, Supple no JVD, Cardiovascular; regular rate and rhythm with S1-S2 no murmur or gallop, Pulmonary; clear bilaterally, Abdomen; soft nontender nondistended no palpable mass or hepatosplenomegaly bowel sounds are present, Rectal; exam not done, Extremities no clubbing cyanosis or edema skin warm and dry, Psych; mood and affect normal and appropriate        Assessment & Plan:  #65  47 year old male with intermittent severe episodes of epigastric pain most consistent with biliary dyskinesia with a significantly decreased ejection fraction on CCK HIDA scan. He is awaiting surgical evaluation for cholecystectomy #2 H. pylori positive-no gastritis #3 adverse drug reaction to Prevpac with palpitations tremor and dizziness-most likely he reacted to the clarithromycin but cannot be certain whether it was a combination of the clarithromycin and amoxicillin -nevertheless he will not be retreated at this time. 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    Plan; surgical referral for laparoscopic cholecystectomy is in progress Patient will use Prilosec as needed for heartburn and indigestion Will not retreat at this time for the H. Pylori, he may have taken a long enough course of antibiotics to eradicate and will reevaluate at the time of followup EGD Discussed the finding of the tiny duodenal carcinoid with the patient and explained that this was removed at the time of upper endoscopy. Discussed with Dr. Jarold Motto. Patient will need followup EGD with reevaluation of the duodenum and biopsies in 3 months. He will followup with Dr. Jarold Motto in about 2 months and we'll schedule EGD at that time

## 2013-03-31 NOTE — Telephone Encounter (Signed)
Pt given appt info for Dr Andrey Campanile on 04/06/13 during OV today.

## 2013-04-01 NOTE — Progress Notes (Signed)
Schedule followup endoscopy before his surgery

## 2013-04-06 ENCOUNTER — Ambulatory Visit (INDEPENDENT_AMBULATORY_CARE_PROVIDER_SITE_OTHER): Payer: BC Managed Care – PPO | Admitting: General Surgery

## 2013-04-08 ENCOUNTER — Ambulatory Visit: Payer: BC Managed Care – PPO | Admitting: Gastroenterology

## 2013-04-12 ENCOUNTER — Encounter (INDEPENDENT_AMBULATORY_CARE_PROVIDER_SITE_OTHER): Payer: Self-pay | Admitting: Surgery

## 2013-04-12 ENCOUNTER — Ambulatory Visit (INDEPENDENT_AMBULATORY_CARE_PROVIDER_SITE_OTHER): Payer: BC Managed Care – PPO | Admitting: Surgery

## 2013-04-12 VITALS — BP 108/64 | HR 70 | Resp 14 | Ht 74.0 in | Wt 180.2 lb

## 2013-04-12 DIAGNOSIS — K828 Other specified diseases of gallbladder: Secondary | ICD-10-CM

## 2013-04-12 MED ORDER — HYDROCODONE-ACETAMINOPHEN 5-325 MG PO TABS: 1.0000 | ORAL_TABLET | Freq: Four times a day (QID) | ORAL | Status: DC | PRN

## 2013-04-12 NOTE — Progress Notes (Signed)
Patient ID: Joel Mason, male   DOB: 1966/03/10, 47 y.o.   MRN: [id number]  Chief Complaint  Patient presents with  . New Evaluation    eval GB    HPI Joel Mason is a 47 y.o. male.   HPI This is a very pleasant Gentleman referred by Dr. Perrin Maltese for evaluation of right upper quadrant abdominal pain.  He has seen Dr. Jarold Motto has also had an upper endoscopy. He reports sharp pain in the right upper quadrant and epigastrium for last several years. The attacks are occurring More frequently. They're always associated with fatty meals.  He denies nausea or vomiting. Bowel movements are normal. 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 Jarold Motto. Past Medical History  Diagnosis Date  . GERD (gastroesophageal reflux disease)   . Goiter   . Hyperlipidemia     Past Surgical History  Procedure Laterality Date  . Hernia repair      Family History  Problem Relation Age of Onset  . Diabetes Mother     Social History History  Substance Use Topics  . Smoking status: Former Smoker    Quit date: 10/24/2003  . Smokeless tobacco: Never Used  . Alcohol Use: No    Allergies  Allergen Reactions  . Amoxicillin Anxiety    insomnia    Current Outpatient Prescriptions  Medication Sig Dispense Refill  . omeprazole (PRILOSEC) 40 MG capsule Take 1 capsule (40 mg total) by mouth daily.  30 capsule  3  . HYDROcodone-acetaminophen (NORCO) 5-325 MG per tablet Take 1-2 tablets by mouth every 6 (six) hours as needed for pain.  30 tablet  1  . ibuprofen (ADVIL,MOTRIN) 200 MG tablet Take 400 mg by mouth at bedtime.       No current facility-administered medications for this visit.    Review of Systems Review of Systems  Constitutional: Negative for fever, chills and unexpected weight change.  HENT: Negative for hearing loss, congestion, sore throat, trouble swallowing and voice change.   Eyes: Negative for visual disturbance.   Respiratory: Negative for cough and wheezing.   Cardiovascular: Negative for chest pain, palpitations and leg swelling.  Gastrointestinal: Positive for abdominal pain. Negative for nausea, vomiting, diarrhea, constipation, blood in stool, abdominal distention, anal bleeding and rectal pain.  Genitourinary: Negative for hematuria and difficulty urinating.  Musculoskeletal: Negative for arthralgias.  Skin: Negative for rash and wound.  Neurological: Negative for seizures, syncope, weakness and headaches.  Hematological: Negative for adenopathy. Does not bruise/bleed easily.  Psychiatric/Behavioral: Negative for confusion.    Blood pressure 108/64, pulse 70, resp. rate 14, height 6\' 2"  (1.88 m), weight 180 lb 3.2 oz (81.738 kg).  Physical Exam Physical Exam  Constitutional: He is oriented to person, place, and time. He appears well-developed and well-nourished. No distress.  HENT:  Head: Normocephalic and atraumatic.  Right Ear: External ear normal.  Left Ear: External ear normal.  Nose: Nose normal.  Mouth/Throat: Oropharynx is clear and moist.  Eyes: Conjunctivae are normal. Pupils are equal, round, and reactive to light. Left eye exhibits no discharge. No scleral icterus.  Neck: Normal range of motion. Neck supple. No tracheal deviation present. No thyromegaly present.  Cardiovascular: Normal rate, regular rhythm, normal heart sounds and intact distal pulses.   No murmur heard. Pulmonary/Chest: Breath sounds normal. No respiratory distress. He has no wheezes. He has no rales.  Abdominal: Soft. There is tenderness. There is guarding.  There is mild tenderness with guarding in  the right upper quadrant  Musculoskeletal: Normal range of motion. He exhibits no edema.  Lymphadenopathy:    He has no cervical adenopathy.  Neurological: He is alert and oriented to person, place, and time.  Skin: Skin is warm and dry. No rash noted. He is not diaphoretic. No erythema.  Psychiatric: His  behavior is normal. Judgment normal.    Data Reviewed I have reviewed his ultrasound which did show slight thickening and part of the gallbladder. There were no gallstones. The HIDA scan showed a 3% gallbladder ejection fraction  Assessment    Biliary dyskinesia     Plan    I suspect he also has chronic cholecystitis. Laparoscopic cholecystectomy is recommended. I discussed this with him in detail and gave him literature regarding the surgery. I discussed the surgery risks which includes but is not limited to bleeding, infection, bile duct injury, bile leak, injury to other structures, the chance this may not resolve all his symptoms, etc. He understands. He would like to wait until January of 2015. I believe this is reasonable as he does not have stones. He may change his mind if his symptoms become more frequent and we'll schedule his surgery sooner        Tailey Top A 04/12/2013, 12:11 PM

## 2013-05-26 ENCOUNTER — Ambulatory Visit (INDEPENDENT_AMBULATORY_CARE_PROVIDER_SITE_OTHER): Payer: BC Managed Care – PPO | Admitting: Gastroenterology

## 2013-05-26 ENCOUNTER — Encounter: Payer: Self-pay | Admitting: Gastroenterology

## 2013-05-26 VITALS — BP 92/66 | HR 63 | Ht 74.0 in | Wt 183.0 lb

## 2013-05-26 DIAGNOSIS — K828 Other specified diseases of gallbladder: Secondary | ICD-10-CM

## 2013-05-26 DIAGNOSIS — D214 Benign neoplasm of connective and other soft tissue of abdomen: Secondary | ICD-10-CM

## 2013-05-26 DIAGNOSIS — A048 Other specified bacterial intestinal infections: Secondary | ICD-10-CM

## 2013-05-26 DIAGNOSIS — R1011 Right upper quadrant pain: Secondary | ICD-10-CM

## 2013-05-26 NOTE — Patient Instructions (Signed)

## 2013-05-26 NOTE — Progress Notes (Signed)
This is a 47 year old [place] male who has biliary dyskinesia with recurrent attacks of right upper quadrant abdominal pain.  Ultrasound has not shown any evidence of gallstones but his bili ejection fraction was only 3%.  He apparently is been seen by surgery, and cholecystectomy apparently has been scheduled. 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  She also had a positive biopsy for H. pylori, but could not tolerate Prevpac treatment because of allergic reaction.  Since he had no evidence of peptic ulcer disease or severe gastritis, it was decided not to pursue H. pylori treatment any further.  He is currently doing well and had one episode of minor right upper quadrant pain 2 weeks ago.  Otherwise he denies abdominal pain, nausea vomiting, dysphagia, or any hepatobiliary complaints or lower gastrointestinal issues.  Current Medications, Allergies, Past Medical History, Past Surgical History, Family History and Social History were reviewed in [employer] record.  ROS: All systems were reviewed and are negative unless otherwise stated in the HPI.          Physical Exam: healthy-appearing patient in no distress.  Blood pressure 92/66, pulse 63 and regular and weight 183 with a BMI of 23.49.  I cannot appreciate stigmata of chronic liver disease.  His abdominal exam is entirely normal without organomegaly, masses or tenderness.  Bowel sounds are normal.  Mental status is normal    Assessment and Plan: Recurrent right upper quadrant pain associated with biliary dyskinesia.  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  We will repeat his endoscopy preoperatively to be sure there is no residual carcinoid tissue.  Also we'll recheck him for H. pylori at the time of his endoscopy.  Otherwise I think we should proceed with surgery as  planned

## 2013-05-31 ENCOUNTER — Encounter: Payer: Self-pay | Admitting: Gastroenterology

## 2013-06-03 ENCOUNTER — Ambulatory Visit (AMBULATORY_SURGERY_CENTER): Payer: BC Managed Care – PPO | Admitting: Gastroenterology

## 2013-06-03 ENCOUNTER — Encounter: Payer: Self-pay | Admitting: Gastroenterology

## 2013-06-03 VITALS — BP 114/78 | HR 66 | Temp 97.9°F | Resp 21 | Ht 74.0 in | Wt 183.0 lb

## 2013-06-03 DIAGNOSIS — R1011 Right upper quadrant pain: Secondary | ICD-10-CM

## 2013-06-03 DIAGNOSIS — C7A010 Malignant carcinoid tumor of the duodenum: Secondary | ICD-10-CM

## 2013-06-03 DIAGNOSIS — D214 Benign neoplasm of connective and other soft tissue of abdomen: Secondary | ICD-10-CM

## 2013-06-03 MED ORDER — SODIUM CHLORIDE 0.9 % IV SOLN: 500.0000 mL | INTRAVENOUS | Status: DC

## 2013-06-03 NOTE — Progress Notes (Signed)
Patient did not experience any of the following events: a burn prior to discharge; a fall within the facility; wrong site/side/patient/procedure/implant event; or a hospital transfer or hospital admission upon discharge from the facility. (G8907) Patient did not have preoperative order for IV antibiotic SSI prophylaxis. (G8918)  

## 2013-06-03 NOTE — Op Note (Signed)
[facility] [address] [place], [address]   ENDOSCOPY PROCEDURE REPORT  PATIENT: Joel, Mason  MR#: [id number] BIRTHDATE: Oct 28, 1965 , 46  yrs. old GENDER: Male ENDOSCOPIST:Dammon Makarewicz Hale Bogus, MD, [facility] REFERRED BY: PROCEDURE DATE:  06/03/2013 PROCEDURE:   EGD w/ biopsy and EGD w/ biopsy for H.pylori ASA CLASS:    Class II INDICATIONS: abdominal pain in the upper right quadrant and Prior small carcinoid removed by EGD 2 mos ago. MEDICATION: Propofol (Diprivan) and Propofol (Diprivan) 240 mg IV TOPICAL ANESTHETIC:  DESCRIPTION OF PROCEDURE:   After the risks and benefits of the procedure were explained, informed consent was obtained.  The LB [id number]  endoscope was introduced through the mouth  and advanced to the second portion of the duodenum .  The instrument was slowly withdrawn as the mucosa was fully examined.    The upper, middle and distal third of the esophagus were carefully inspected and no abnormalities were noted.  The z-line was well seen at the GEJ.  The endoscope was pushed into the fundus which was normal including a retroflexed view.  The antrum, gastric body, first and second part of the duodenum were unremarkable.Multiple biopsies of duodenal bulb none, but no nodule or erosions or other lesions noted.CLO Bx. also done. Retroflexed views revealed no abnormalities.    The scope was then withdrawn from the patient and the procedure completed.  COMPLICATIONS: There were no complications.   ENDOSCOPIC IMPRESSION: Normal EGD .Marland Kitchenno duodenal nodule noted,no ulcers,cause for RUQ pain...has documented dykinesia of his gallbladder.  RECOMMENDATIONS: Await biopsy results..if negative, will procede with GB surgery.    _______________________________ eSignedMardella Layman, MD, [facility] 06/03/2013 2:43 PM  cc>: Dr. Janelle Floor standard discharge   PATIENT NAME:  Joel, Mason MR#: [id number]

## 2013-06-03 NOTE — Progress Notes (Signed)
Procedure ends, to recovery, report given and VSS. 

## 2013-06-03 NOTE — Progress Notes (Signed)
Called to room to assist during endoscopic procedure.  Patient ID and intended procedure confirmed with present staff. Received instructions for my participation in the procedure from the performing physician.  

## 2013-06-03 NOTE — Patient Instructions (Signed)
YOU HAD AN ENDOSCOPIC PROCEDURE TODAY AT THE [facility]: Refer to the procedure report that was given to you for any specific questions about what was found during the examination.  If the procedure report does not answer your questions, please call your gastroenterologist to clarify.  If you requested that your care partner not be given the details of your procedure findings, then the procedure report has been included in a sealed envelope for you to review at your convenience later.  YOU SHOULD EXPECT: Some feelings of bloating in the abdomen. Passage of more gas than usual.  Walking can help get rid of the air that was put into your GI tract during the procedure and reduce the bloating. If you had a lower endoscopy (such as a colonoscopy or flexible sigmoidoscopy) you may notice spotting of blood in your stool or on the toilet paper. If you underwent a bowel prep for your procedure, then you may not have a normal bowel movement for a few days.  DIET: Your first meal following the procedure should be a light meal and then it is ok to progress to your normal diet.  A half-sandwich or bowl of soup is an example of a good first meal.  Heavy or fried foods are harder to digest and may make you feel nauseous or bloated.  Likewise meals heavy in dairy and vegetables can cause extra gas to form and this can also increase the bloating.  Drink plenty of fluids but you should avoid alcoholic beverages for 24 hours.  ACTIVITY: Your care partner should take you home directly after the procedure.  You should plan to take it easy, moving slowly for the rest of the day.  You can resume normal activity the day after the procedure however you should NOT DRIVE or use heavy machinery for 24 hours (because of the sedation medicines used during the test).    SYMPTOMS TO REPORT IMMEDIATELY: A gastroenterologist can be reached at any hour.  During normal business hours, 8:30 AM to 5:00 PM Monday through Friday,  call [phone].  After hours and on weekends, please call the GI answering service at [phone] who will take a message and have the physician on call contact you.     Following upper endoscopy (EGD)  Vomiting of blood or coffee ground material  New chest pain or pain under the shoulder blades  Painful or persistently difficult swallowing  New shortness of breath  Fever of 100F or higher  Black, tarry-looking stools  FOLLOW UP: If any biopsies were taken you will be contacted by phone or by letter within the next 1-3 weeks.  Call your gastroenterologist if you have not heard about the biopsies in 3 weeks.  Our staff will call the home number listed on your records the next business day following your procedure to check on you and address any questions or concerns that you may have at that time regarding the information given to you following your procedure. This is a courtesy call and so if there is no answer at the home number and we have not heard from you through the emergency physician on call, we will assume that you have returned to your regular daily activities without incident.  SIGNATURES/CONFIDENTIALITY: You and/or your care partner have signed paperwork which will be entered into your electronic medical record.  These signatures attest to the fact that that the information above on your After Visit Summary has been reviewed and is understood.    Full responsibility of the confidentiality of this discharge information lies with you and/or your care-partner.    Await biopsy results 

## 2013-06-06 ENCOUNTER — Telehealth: Payer: Self-pay | Admitting: *Deleted

## 2013-06-06 LAB — HELICOBACTER PYLORI SCREEN-BIOPSY: UREASE: NEGATIVE

## 2013-06-06 NOTE — Telephone Encounter (Signed)
  Follow up Call-  Call back number 06/03/2013 03/11/2013  Post procedure Call Back phone  # [phone]  Permission to leave phone message Yes Yes     No answer,left message.

## 2013-06-07 ENCOUNTER — Encounter: Payer: Self-pay | Admitting: Gastroenterology

## 2013-06-10 ENCOUNTER — Other Ambulatory Visit: Payer: Self-pay

## 2013-06-10 ENCOUNTER — Telehealth: Payer: Self-pay | Admitting: *Deleted

## 2013-06-10 ENCOUNTER — Encounter: Payer: Self-pay | Admitting: Gastroenterology

## 2013-06-10 ENCOUNTER — Telehealth: Payer: Self-pay

## 2013-06-10 DIAGNOSIS — D3A019 Benign carcinoid tumor of the small intestine, unspecified portion: Secondary | ICD-10-CM

## 2013-06-10 DIAGNOSIS — D3A00 Benign carcinoid tumor of unspecified site: Secondary | ICD-10-CM

## 2013-06-10 NOTE — Telephone Encounter (Signed)
Message copied by Donata Duff on Fri Jun 10, 2013  3:26 PM ------      Message from: Rachael Fee      Created: Fri Jun 10, 2013  3:23 PM       He needs upper EUS with radial +/- linear, duodenal carcinoid, ++MAC, next avail EUS Thursday,            Thanks       ------

## 2013-06-10 NOTE — Telephone Encounter (Signed)
Pt has been scheduled for EUS on 07/07/13 930 am

## 2013-06-10 NOTE — Telephone Encounter (Signed)
EUS scheduled, pt instructed and medications reviewed.  Patient instructions mailed to home.  Patient to call with any questions or concerns.  

## 2013-06-10 NOTE — Telephone Encounter (Signed)
Informed pt of path results and that he needs further testing; he needs an EUS and Patti or somene else will call to schedule. Pt stated understanding.

## 2013-06-15 ENCOUNTER — Encounter (HOSPITAL_COMMUNITY): Payer: Self-pay | Admitting: *Deleted

## 2013-06-15 ENCOUNTER — Encounter (HOSPITAL_COMMUNITY): Payer: Self-pay | Admitting: Pharmacy Technician

## 2013-06-17 ENCOUNTER — Encounter: Payer: Self-pay | Admitting: Gastroenterology

## 2013-07-07 ENCOUNTER — Encounter (HOSPITAL_COMMUNITY)
Admission: RE | Disposition: A | Discharge status: Home / Self Care | Payer: Self-pay | Source: Ambulatory Visit | Attending: Gastroenterology

## 2013-07-07 ENCOUNTER — Encounter (HOSPITAL_COMMUNITY): Payer: BC Managed Care – PPO | Admitting: Anesthesiology

## 2013-07-07 ENCOUNTER — Ambulatory Visit (HOSPITAL_COMMUNITY)
Admission: RE | Admit: 2013-07-07 | Discharge: 2013-07-07 | Disposition: A | Discharge status: Home / Self Care | Payer: BC Managed Care – PPO | Source: Ambulatory Visit | Attending: Gastroenterology | Admitting: Gastroenterology

## 2013-07-07 ENCOUNTER — Ambulatory Visit (HOSPITAL_COMMUNITY): Payer: BC Managed Care – PPO | Admitting: Anesthesiology

## 2013-07-07 ENCOUNTER — Encounter (HOSPITAL_COMMUNITY): Payer: Self-pay

## 2013-07-07 DIAGNOSIS — E785 Hyperlipidemia, unspecified: Secondary | ICD-10-CM | POA: Insufficient documentation

## 2013-07-07 DIAGNOSIS — K219 Gastro-esophageal reflux disease without esophagitis: Secondary | ICD-10-CM | POA: Insufficient documentation

## 2013-07-07 DIAGNOSIS — D3A010 Benign carcinoid tumor of the duodenum: Secondary | ICD-10-CM | POA: Insufficient documentation

## 2013-07-07 DIAGNOSIS — D490 Neoplasm of unspecified behavior of digestive system: Secondary | ICD-10-CM

## 2013-07-07 DIAGNOSIS — K2980 Duodenitis without bleeding: Secondary | ICD-10-CM | POA: Insufficient documentation

## 2013-07-07 DIAGNOSIS — D3A019 Benign carcinoid tumor of the small intestine, unspecified portion: Secondary | ICD-10-CM

## 2013-07-07 HISTORY — PX: EUS: SHX5427

## 2013-07-07 SURGERY — UPPER ENDOSCOPIC ULTRASOUND (EUS) LINEAR
Anesthesia: Monitor Anesthesia Care

## 2013-07-07 MED ORDER — MIDAZOLAM HCL 5 MG/5ML IJ SOLN
INTRAMUSCULAR | Status: DC | PRN
  Administered 2013-07-07: 2 mg via INTRAVENOUS

## 2013-07-07 MED ORDER — PROPOFOL 10 MG/ML IV BOLUS
INTRAVENOUS | Status: AC
  Filled 2013-07-07: qty 20

## 2013-07-07 MED ORDER — PROPOFOL INFUSION 10 MG/ML OPTIME
INTRAVENOUS | Status: DC | PRN
  Administered 2013-07-07: 140 ug/kg/min via INTRAVENOUS

## 2013-07-07 MED ORDER — SODIUM CHLORIDE 0.9 % IV SOLN: INTRAVENOUS | Status: DC

## 2013-07-07 MED ORDER — SPOT INK MARKER SYRINGE KIT
PACK | SUBMUCOSAL | Status: DC | PRN
  Administered 2013-07-07: 2 mL via SUBMUCOSAL

## 2013-07-07 MED ORDER — LACTATED RINGERS IV SOLN
INTRAVENOUS | Status: DC
  Administered 2013-07-07: 09:00:00 via INTRAVENOUS

## 2013-07-07 MED ORDER — KETAMINE HCL 10 MG/ML IJ SOLN
INTRAMUSCULAR | Status: DC | PRN
  Administered 2013-07-07: 10 mg via INTRAVENOUS
  Administered 2013-07-07: 20 mg via INTRAVENOUS
  Administered 2013-07-07 (×2): 10 mg via INTRAVENOUS

## 2013-07-07 MED ORDER — SPOT INK MARKER SYRINGE KIT
PACK | SUBMUCOSAL | Status: AC
  Filled 2013-07-07: qty 5

## 2013-07-07 MED ORDER — MIDAZOLAM HCL 2 MG/2ML IJ SOLN
INTRAMUSCULAR | Status: AC
  Filled 2013-07-07: qty 2

## 2013-07-07 NOTE — H&P (Signed)
  HPI: This is a patient with known duodenal carcinoid.  Last EGD there was no clear mucosal abnormality (Dr. Jarold Motto), however biopsies again showed carcinoid    Past Medical History  Diagnosis Date  . GERD (gastroesophageal reflux disease)   . Goiter     no current treatment for goiter  . Hyperlipidemia   . H. pylori infection     Past Surgical History  Procedure Laterality Date  . Hernia repair  2001    Current Facility-Administered Medications  Medication Dose Route Frequency Provider Last Rate Last Dose  . 0.9 %  sodium chloride infusion   Intravenous Continuous Rachael Fee, MD      . lactated ringers infusion   Intravenous Continuous Rachael Fee, MD 20 mL/hr at 07/07/13 1610      Allergies as of 06/10/2013 - Review Complete 06/03/2013  Allergen Reaction Noted  . Amoxicillin Anxiety 04/12/2013    Family History  Problem Relation Age of Onset  . Diabetes Mother     History   Social History  . Marital Status: Married    Spouse Name: N/A    Number of Children: N/A  . Years of Education: N/A   Occupational History  . Not on file.   Social History Main Topics  . Smoking status: Former Smoker -- 1.00 packs/day for 10 years    Types: Cigarettes    Quit date: 10/24/2003  . Smokeless tobacco: Current User    Types: Chew  . Alcohol Use: No  . Drug Use: No  . Sexual Activity: Not on file   Other Topics Concern  . Not on file   Social History Narrative  . 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 Will plan to tatoo the site.  He will likely  need surgical resection

## 2013-07-07 NOTE — Anesthesia Postprocedure Evaluation (Signed)
  Anesthesia Post-op Note  Patient: Joel Mason  Procedure(s) Performed: Procedure(s) (LRB): UPPER ENDOSCOPIC ULTRASOUND (EUS) LINEAR (N/A)  Patient Location: PACU  Anesthesia Type: MAC  Level of Consciousness: awake and alert   Airway and Oxygen Therapy: Patient Spontanous Breathing  Post-op Pain: mild  Post-op Assessment: Post-op Vital signs reviewed, Patient's Cardiovascular Status Stable, Respiratory Function Stable, Patent Airway and No signs of Nausea or vomiting  Last Vitals:  Filed Vitals:   07/07/13 1005  BP: 117/75  Pulse: 59  Temp:   Resp: 15    Post-op Vital Signs: stable   Complications: No apparent anesthesia complications

## 2013-07-07 NOTE — Op Note (Signed)
[facility] [address] [place], [address]   ENDOSCOPIC ULTRASOUND PROCEDURE REPORT  PATIENT: Joel Mason, Joel Mason  MR#: [id number] BIRTHDATE: [birth year]  GENDER: Male ENDOSCOPIST: Rachael Fee, MD REFERRED BY:  Mardella Layman, M.D. PROCEDURE DATE:  07/07/2013 PROCEDURE:   Upper EUS, EGD wtih biopsy, EGD with submucosal injection ASA CLASS:      Class II INDICATIONS:   small "5mm" nodule removed 3 months ago EGD Dr. Jarold Motto (biopsies showed carcinoid); repeat EGD (Dr.  Jarold Motto) 1 month ago, bulb looked normal but random bulb biopsies again showed carcinoid. MEDICATIONS: MAC sedation, administered by CRNA  DESCRIPTION OF PROCEDURE:   After the risks benefits and alternatives of the procedure were  explained, informed consent was obtained. The patient was then placed in the left, lateral, decubitus postion and IV sedation was administered. Throughout the procedure, the patients blood pressure, pulse and oxygen saturations were monitored continuously.  Under direct visualization, the Pentax Radial EUS [id number]  endoscope was introduced through the mouth  and advanced to the second portion of the duodenum .  Water was used as necessary to provide an acoustic interface.  Upon completion of the imaging, water was removed and the patient was sent to the recovery room in satisfactory condition.   Endoscopic findings: 1. Essentially normal UGI tract. In duodenal bulb the mucosa appeared normal.  Following normal EUS examination I elected to biopsy the bulb in two regions.  One I called the "anterior duodenal bulb" and the other I labeled "posterior duodenal bulb." Those samples were put in separate jars and then I tattoo'd the region called "anterior duodenal bulb" with submucosal injection of [place] Ink.  EUS findings: 1. Normal dudoenal wall echolayering. 2. Pancreas was normal. 3. CBD was normal, non-dilated. 4. Gallbladder was normal. 5. Main  pancreatic ductw was normal 6. No peripancreatic, periportal adnenopathy.  Impression: Await final pathology from these biopsies.  If persistent carcinoid in duodenum, will need to consider surgical resection (+/- further imaging such as octreotide scan to check for other sites of disease).  If NO persistent carcinoid in duodenum, will likely recommend only octreotide scan first.  I will also put him on for next week's multidisciplinary GI Tumor Board.  _______________________________ Rosalie DoctorRachael Fee, MD 07/07/2013 9:35 AM

## 2013-07-07 NOTE — Transfer of Care (Signed)
Immediate Anesthesia Transfer of Care Note  Patient: Joel Mason  Procedure(s) Performed: Procedure(s): UPPER ENDOSCOPIC ULTRASOUND (EUS) LINEAR (N/A)  Patient Location: PACU  Anesthesia Type:MAC  Level of Consciousness: sedated and responds to stimulation  Airway & Oxygen Therapy: Patient Spontanous Breathing and Patient connected to nasal cannula oxygen  Post-op Assessment: Report given to PACU RN and Post -op Vital signs reviewed and stable  Post vital signs: Reviewed and stable  Complications: No apparent anesthesia complications

## 2013-07-07 NOTE — Anesthesia Preprocedure Evaluation (Addendum)
Anesthesia Evaluation  Patient identified by MRN, date of birth, ID band Patient awake    Reviewed: Allergy & Precautions, H&P , NPO status , Patient's Chart, lab work & pertinent test results  Airway Mallampati: II TM Distance: >3 FB Neck ROM: Full    Dental no notable dental hx.    Pulmonary neg pulmonary ROS, former smoker,  breath sounds clear to auscultation  Pulmonary exam normal       Cardiovascular negative cardio ROS  Rhythm:Regular Rate:Normal     Neuro/Psych negative neurological ROS  negative psych ROS   GI/Hepatic negative GI ROS, Neg liver ROS,   Endo/Other  negative endocrine ROS  Renal/GU negative Renal ROS  negative genitourinary   Musculoskeletal negative musculoskeletal ROS (+)   Abdominal   Peds negative pediatric ROS (+)  Hematology negative hematology ROS (+)   Anesthesia Other Findings   Reproductive/Obstetrics negative OB ROS                          Anesthesia Physical Anesthesia Plan  ASA: II  Anesthesia Plan: MAC   Post-op Pain Management:    Induction:   Airway Management Planned:   Additional Equipment:   Intra-op Plan:   Post-operative Plan:   Informed Consent: I have reviewed the patients History and Physical, chart, labs and discussed the procedure including the risks, benefits and alternatives for the proposed anesthesia with the patient or authorized representative who has indicated his/her understanding and acceptance.   Dental advisory given  Plan Discussed with: CRNA  Anesthesia Plan Comments:         Anesthesia Quick Evaluation

## 2013-07-08 ENCOUNTER — Encounter (HOSPITAL_COMMUNITY): Payer: Self-pay | Admitting: Gastroenterology

## 2013-07-12 ENCOUNTER — Other Ambulatory Visit: Payer: Self-pay

## 2013-07-12 DIAGNOSIS — R933 Abnormal findings on diagnostic imaging of other parts of digestive tract: Secondary | ICD-10-CM

## 2013-07-13 ENCOUNTER — Telehealth: Payer: Self-pay

## 2013-07-13 ENCOUNTER — Telehealth (INDEPENDENT_AMBULATORY_CARE_PROVIDER_SITE_OTHER): Payer: Self-pay

## 2013-07-13 DIAGNOSIS — D3A00 Benign carcinoid tumor of unspecified site: Secondary | ICD-10-CM

## 2013-07-13 NOTE — Telephone Encounter (Signed)
LM for Dr. Larae Grooms CMA to call me regarding this patient.  Please call Joel Mason.

## 2013-07-13 NOTE — Telephone Encounter (Signed)
Message copied by Donata Duff on Wed Jul 13, 2013  8:30 AM ------      Message from: Rob Bunting P      Created: Wed Jul 13, 2013  8:19 AM       Alexia Freestone,            His case was discussed at tumor board today.  Dr Donell Beers and I agree he needs test called octreotide scan (radiology, nuc med), would be nice prior to her upcoming appt.            thanks ------

## 2013-07-13 NOTE — Telephone Encounter (Signed)
Pt has been scheduled for 1/6/02/01/2014 at [facility] radiology 1 pm, test is $4000 and if the pt is not going to keep it they MUST call several days in advance to cancel or they will be responsible for the cost. Pt has been notified and verbalized understanding that he MUST call if he can not make the appt.  He will be contacted regarding his appt with Dr Donell Beers

## 2013-07-25 ENCOUNTER — Encounter (INDEPENDENT_AMBULATORY_CARE_PROVIDER_SITE_OTHER): Payer: Self-pay | Admitting: General Surgery

## 2013-07-25 ENCOUNTER — Ambulatory Visit (INDEPENDENT_AMBULATORY_CARE_PROVIDER_SITE_OTHER): Payer: BC Managed Care – PPO | Admitting: General Surgery

## 2013-07-25 VITALS — BP 110/60 | HR 60 | Temp 98.2°F | Resp 14 | Ht 74.0 in | Wt 183.4 lb

## 2013-07-25 DIAGNOSIS — D490 Neoplasm of unspecified behavior of digestive system: Secondary | ICD-10-CM

## 2013-07-25 DIAGNOSIS — K828 Other specified diseases of gallbladder: Secondary | ICD-10-CM | POA: Insufficient documentation

## 2013-07-25 DIAGNOSIS — D3A019 Benign carcinoid tumor of the small intestine, unspecified portion: Secondary | ICD-10-CM

## 2013-07-25 NOTE — Patient Instructions (Signed)
Get octreotide scan next week.    If negative, we will take out gallbladder only and repeat endoscopy in 6 months.  If positive, I will have you come back to see me and we will discuss other surgery.   Gallbladder information.    IF YOU ARE TAKING ASPIRIN, COUMADIN/WARFARIN, PLAVIX, OR OTHER BLOOD THINNER, PLEASE LET us KNOW IMMEDIATELY.  WE WILL NEED TO DISCUSS WITH THE PRESCRIBING PROVIDER IF THESE ARE SAFE TO STOP. IF THESE ARE NOT STOPPED AT THE APPROPRIATE TIME, THIS WILL RESULT IN A DELAY FOR YOUR SURGERY.  DO NOT TAKE THESE MEDICATIONS OR IBUPROFEN/NAPROXEN WITHIN A WEEK BEFORE SURGERY.   Usually we are able to remove the gallbladder with the laparoscopic equipment (minimally invasive).  If the anatomy is unclear or if there is severe infection or scar tissue we may need to make a larger incision to remove the gallbladder safely.  Some patients have evidence of gallstones remaining in their ducts that are not able to cleared surgically and may need ERCP (endoscopy) to remove them in the few days following surgery.     Otherwise, the main risks of surgery are bleeding, infection, damage to other structures, and hernia at the  incision sites.    These complications may lead to additional procedures such as drain placement or endoscopy, and in rare cases may lead to other surgeries.   These are not common risks, but do occur.     Most patients have some constipation in the week after surgery.  You may need over the counter stool softeners or laxatives if you experience difficulty having bowel movements.    Some patients experience diarrhea or experience a need to have a bowel movement shortly after eating.  Please discuss this with me when you come back if that occurs because you may require medication if it is severe.    If the following occur, call our office at [phone]: If you have a fever over 101 or pain that is severe despite narcotics. If you have redness or drainage  at the wound. If your stools become clay-colored or you become jaundiced (yellow skin/eyes) If you develop persistent nausea or vomiting.  I will follow you back up in 3-4 weeks.    Please submit any paperwork about time off work/insurance forms to the front desk.

## 2013-07-25 NOTE — Progress Notes (Signed)
Chief Complaint  Patient presents with  . New Evaluation    new cancer    HISTORY: Patient is a 47 year old male who presents with abdominal pain. He has been having pain for around 4-5 years, but his attacks have started to come closer and closer together. They used to be very infrequent, but now occur every 2-4 weeks. They were postprandial in nature. Some of them lasted up to 9 hours. He was not found having gallstones on ultrasound.  As part of his workup, he had an endoscopy. 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  He underwent endoscopic ultrasound and repeat biopsy. He had a biopsy of the duodenal bulb that was reactive only, and one in the duodenal bulb that was suspicious for carcinoid. These were not labeled correctly as anterior or posterior. One of them was tattooed, but it is not clear whether it was the one that was reactive or the opposite one.  He also not getting a HIDA scan. This demonstrated biliary dyskinesia.    Past Medical History  Diagnosis Date  . GERD (gastroesophageal reflux disease)   . Goiter     no current treatment for goiter  . Hyperlipidemia   . H. pylori infection   . Neuromuscular disorder     Past Surgical History  Procedure Laterality Date  . Hernia repair  2001  . Eus N/A 07/07/2013    Procedure: UPPER ENDOSCOPIC ULTRASOUND (EUS) LINEAR;  Surgeon: Rachael Fee, MD;  Location: [facility];  Service: Endoscopy;  Laterality: N/A;    Current Outpatient Prescriptions  Medication Sig Dispense Refill  . HYDROcodone-acetaminophen (NORCO/VICODIN) 5-325 MG per tablet Take 1 tablet by mouth every 6 (six) hours as needed for moderate pain.       Marland Kitchen omeprazole (PRILOSEC) 40 MG capsule Take 40 mg by mouth daily.        No current facility-administered medications for this visit.     Allergies  Allergen Reactions  . Amoxicillin Anxiety    Insomnia, nervous     Family History  Problem Relation Age of Onset  . Diabetes Mother       History   Social History  . Marital Status: Married    Spouse Name: N/A    Number of Children: N/A  . Years of Education: N/A   Social History Main Topics  . Smoking status: Former Smoker -- 1.00 packs/day for 10 years    Types: Cigarettes    Quit date: 10/24/2003  . Smokeless tobacco: Current User    Types: Chew  . Alcohol Use: No  . Drug Use: No  . Sexual Activity: None   REVIEW OF SYSTEMS - PERTINENT POSITIVES ONLY: 12 point review of systems negative other than HPI and PMH   EXAM: Filed Vitals:   07/25/13 1420  BP: 110/60  Pulse: 60  Temp: 98.2 F (36.8 C)  Resp: 14   Filed Weights   07/25/13 1420  Weight: 183 lb 6.4 oz (83.19 kg)     Gen:  No acute distress.  Well nourished and well groomed.   Neurological: Alert and oriented to person, place, and time. Coordination normal.  Head: Normocephalic and atraumatic.  Eyes: Conjunctivae are normal. Pupils are equal, round, and reactive to light. No scleral icterus.  Neck: Normal range of motion. Neck supple. No tracheal deviation or thyromegaly present.  Cardiovascular: Normal rate, regular rhythm, normal heart sounds and intact distal pulses.  Exam reveals no gallop and no friction rub.  No  murmur heard. Respiratory: Effort normal.  No respiratory distress. No chest wall tenderness. Breath sounds normal.  No wheezes, rales or rhonchi.  GI: Soft. Bowel sounds are normal. The abdomen is soft and nontender.  There is no rebound and no guarding.  Musculoskeletal: Normal range of motion. Extremities are nontender.  Lymphadenopathy: No cervical, preauricular, postauricular or axillary adenopathy is present Skin: Skin is warm and dry. No rash noted. No diaphoresis. No erythema. No pallor. No clubbing, cyanosis, or edema.   Psychiatric: Normal mood and affect. Behavior is normal. Judgment and thought content normal.    LABORATORY RESULTS: Available labs are reviewed  Diagnosis Surgical [P], duodenal bulb, biopsy -  CARCINOID TUMOR (LOW GRADE NEUROENDOCRINE TUMOR). Pecola Leisure MD Pathologist, Electronic Signature (Case signed 06/07/2013)  RADIOLOGY RESULTS: See E-Chart or I-Site for most recent results.  Images and reports are reviewed. HIDA IMPRESSION:  Markedly diminished gallbladder ejection fraction. No pain with CCK  Infusion.  Ultrasound IMPRESSION: No cholelithiasis evident.There is slight increased  echogenicity in small portions of the gallbladder wall but no  acoustic shadowing is seen posterior to these and no thickening of  the wall is evident. On some images there is possible minimal  comet-tail artifact which may be associated with Rokitansky-Aschoff  sinuses of adenomyomatosis.  Otherwise normal examination. Portions of pancreas were obscured by  bowel gas.   ASSESSMENT AND PLAN: Carcinoid tumor of small intestine Patient has octreotide scan scheduled January 6. If we do not see any activity on this, I would schedule him for a laparoscopic cholecystectomy and repeat endoscopy in 6 months.  If there is residual activity in the duodenum or if there is activity elsewhere, I will see him back in clinic to discuss additional surgery.  The surgical procedure was described to the patient in detail.  The patient was given educational material.   I discussed the incision type and location, the location of the gallbladder, the anatomy of the bile ducts and arteries, and the typical progression of surgery.  I discussed the possibility of converting to an open operation.  I advised of the risks of bleeding, infection, damage to other structures (such as the bile duct, intestine or liver), bile leak, need for other procedures or surgeries, and post op diarrhea/constipation.  We discussed the risk of blood clot.  We discussed the recovery period and post operative restrictions.  The patient was advised against taking blood thinners the week before surgery.       Maudry Diego MD Surgical  Oncology, General and Endocrine Surgery [facility]  Visit Diagnoses: 1. Biliary dyskinesia   2. Carcinoid tumor of small intestine     Primary Care Physician: Tally Due, MD

## 2013-07-25 NOTE — Assessment & Plan Note (Signed)
Patient has octreotide scan scheduled January 6. If we do not see any activity on this, I would schedule him for a laparoscopic cholecystectomy and repeat endoscopy in 6 months.  If there is residual activity in the duodenum or if there is activity elsewhere, I will see him back in clinic to discuss additional surgery.  The surgical procedure was described to the patient in detail.  The patient was given educational material.   I discussed the incision type and location, the location of the gallbladder, the anatomy of the bile ducts and arteries, and the typical progression of surgery.  I discussed the possibility of converting to an open operation.  I advised of the risks of bleeding, infection, damage to other structures (such as the bile duct, intestine or liver), bile leak, need for other procedures or surgeries, and post op diarrhea/constipation.  We discussed the risk of blood clot.  We discussed the recovery period and post operative restrictions.  The patient was advised against taking blood thinners the week before surgery.

## 2013-08-02 ENCOUNTER — Encounter (HOSPITAL_COMMUNITY)
Admission: RE | Admit: 2013-08-02 | Discharge: 2013-08-02 | Disposition: A | Discharge status: Home / Self Care | Payer: BC Managed Care – PPO | Source: Ambulatory Visit | Attending: Gastroenterology | Admitting: Gastroenterology

## 2013-08-02 DIAGNOSIS — D3A00 Benign carcinoid tumor of unspecified site: Secondary | ICD-10-CM | POA: Insufficient documentation

## 2013-08-03 ENCOUNTER — Encounter (HOSPITAL_COMMUNITY)
Admission: RE | Admit: 2013-08-03 | Discharge: 2013-08-03 | Disposition: A | Discharge status: Home / Self Care | Payer: BC Managed Care – PPO | Source: Ambulatory Visit | Attending: Gastroenterology | Admitting: Gastroenterology

## 2013-08-03 MED ORDER — INDIUM IN-111 PENTETREOTIDE IV KIT
6.4000 | PACK | Freq: Once | INTRAVENOUS | Status: AC | PRN
  Administered 2013-08-03: 6.4 via INTRAVENOUS

## 2013-08-04 ENCOUNTER — Telehealth (INDEPENDENT_AMBULATORY_CARE_PROVIDER_SITE_OTHER): Payer: Self-pay

## 2013-08-04 ENCOUNTER — Encounter (HOSPITAL_COMMUNITY): Admission: RE | Admit: 2013-08-04 | Payer: BC Managed Care – PPO | Source: Ambulatory Visit

## 2013-08-04 NOTE — Telephone Encounter (Signed)
LMOV for pt to call our office.  His Octreotide scan shows he will need his gallbladder removed.  Would he like to come back to see Dr. Barry Dienes before she schedules that surgery?  It is at his discrestion.  Also, we will need to schedule and EGD in the next six months.  We will contact him in early July 2015 to set that up.

## 2013-08-05 NOTE — Telephone Encounter (Signed)
Spoke with the patient and he is fine with scheduling his surgery with Dr. Barry Dienes.  We will contact him later in the month to schedule.

## 2013-08-05 NOTE — Telephone Encounter (Signed)
The pt called back.  I gave him the results of the scan. I informed him about his EGD due in 6 months.  He would like to schedule surgery with Dr Ninfa Linden who he had seen back in September.  I told him I will let him know so his orders can been completed.

## 2013-08-19 ENCOUNTER — Other Ambulatory Visit (INDEPENDENT_AMBULATORY_CARE_PROVIDER_SITE_OTHER): Payer: Self-pay | Admitting: General Surgery

## 2013-08-19 ENCOUNTER — Telehealth (INDEPENDENT_AMBULATORY_CARE_PROVIDER_SITE_OTHER): Payer: Self-pay

## 2013-08-19 ENCOUNTER — Telehealth (INDEPENDENT_AMBULATORY_CARE_PROVIDER_SITE_OTHER): Payer: Self-pay | Admitting: General Surgery

## 2013-08-19 DIAGNOSIS — K219 Gastro-esophageal reflux disease without esophagitis: Secondary | ICD-10-CM

## 2013-08-19 NOTE — Telephone Encounter (Signed)
Please write old school 2 sided OR posting sheet for this patient he is scheduled at [facility] and they are not on EPIC they still take faxed orders thanks  Or you can do it thru [facility] email like Dr Johney Maine does

## 2013-08-19 NOTE — Telephone Encounter (Signed)
Pt requesting a refill on the Protonix be sent to [facility]. Scientist, product/process development. Please call pt and let them know if this gets approved by Dr Barry Dienes.

## 2013-08-21 NOTE — Telephone Encounter (Signed)
That is fine.  oK to refill.

## 2013-08-22 MED ORDER — OMEPRAZOLE 40 MG PO CPDR: 40.0000 mg | DELAYED_RELEASE_CAPSULE | Freq: Every day | ORAL | Status: AC

## 2013-08-22 NOTE — Telephone Encounter (Signed)
What is epic email second side posting sheet?

## 2013-08-22 NOTE — Telephone Encounter (Signed)
Called pt to notify him that we prescribed his Protonix medication.

## 2013-08-22 NOTE — Addendum Note (Signed)
Addended by: Illene Regulus on: 08/22/2013 09:40 AM   Modules accepted: Orders

## 2013-09-13 ENCOUNTER — Encounter (HOSPITAL_COMMUNITY): Payer: Self-pay | Admitting: Pharmacy Technician

## 2013-09-14 ENCOUNTER — Encounter (HOSPITAL_COMMUNITY)
Admission: RE | Admit: 2013-09-14 | Discharge: 2013-09-14 | Disposition: A | Discharge status: Home / Self Care | Payer: BC Managed Care – PPO | Source: Ambulatory Visit | Attending: General Surgery | Admitting: General Surgery

## 2013-09-14 ENCOUNTER — Encounter (HOSPITAL_COMMUNITY): Payer: Self-pay

## 2013-09-14 DIAGNOSIS — Z01812 Encounter for preprocedural laboratory examination: Secondary | ICD-10-CM | POA: Insufficient documentation

## 2013-09-14 HISTORY — DX: Malignant (primary) neoplasm, unspecified: C80.1

## 2013-09-14 HISTORY — DX: Headache: R51

## 2013-09-14 LAB — CBC WITH DIFFERENTIAL/PLATELET
BASOS PCT: 1 % (ref 0–1)
Basophils Absolute: 0.1 10*3/uL (ref 0.0–0.1)
EOS ABS: 0.1 10*3/uL (ref 0.0–0.7)
EOS PCT: 1 % (ref 0–5)
HCT: 44.2 % (ref 39.0–52.0)
Hemoglobin: 16.1 g/dL (ref 13.0–17.0)
LYMPHS ABS: 2.4 10*3/uL (ref 0.7–4.0)
Lymphocytes Relative: 34 % (ref 12–46)
MCH: 31.3 pg (ref 26.0–34.0)
MCHC: 36.4 g/dL — AB (ref 30.0–36.0)
MCV: 86 fL (ref 78.0–100.0)
Monocytes Absolute: 0.5 10*3/uL (ref 0.1–1.0)
Monocytes Relative: 7 % (ref 3–12)
Neutro Abs: 4 10*3/uL (ref 1.7–7.7)
Neutrophils Relative %: 57 % (ref 43–77)
PLATELETS: 244 10*3/uL (ref 150–400)
RBC: 5.14 MIL/uL (ref 4.22–5.81)
RDW: 12.4 % (ref 11.5–15.5)
WBC: 7 10*3/uL (ref 4.0–10.5)

## 2013-09-14 LAB — URINALYSIS, ROUTINE W REFLEX MICROSCOPIC
Bilirubin Urine: NEGATIVE
Glucose, UA: NEGATIVE mg/dL
Ketones, ur: NEGATIVE mg/dL
LEUKOCYTES UA: NEGATIVE
Nitrite: NEGATIVE
PROTEIN: NEGATIVE mg/dL
Specific Gravity, Urine: 1.019 (ref 1.005–1.030)
Urobilinogen, UA: 0.2 mg/dL (ref 0.0–1.0)
pH: 6.5 (ref 5.0–8.0)

## 2013-09-14 LAB — COMPREHENSIVE METABOLIC PANEL
ALBUMIN: 4.1 g/dL (ref 3.5–5.2)
ALT: 20 U/L (ref 0–53)
AST: 17 U/L (ref 0–37)
Alkaline Phosphatase: 81 U/L (ref 39–117)
BUN: 13 mg/dL (ref 6–23)
CALCIUM: 9.9 mg/dL (ref 8.4–10.5)
CO2: 26 mEq/L (ref 19–32)
CREATININE: 0.9 mg/dL (ref 0.50–1.35)
Chloride: 97 mEq/L (ref 96–112)
GFR calc non Af Amer: >90 mL/min (ref >90)
Glucose, Bld: 97 mg/dL (ref 70–99)
Potassium: 3.9 mEq/L (ref 3.7–5.3)
Sodium: 135 mEq/L — ABNORMAL LOW (ref 137–147)
TOTAL PROTEIN: 7.8 g/dL (ref 6.0–8.3)
Total Bilirubin: 0.7 mg/dL (ref 0.3–1.2)

## 2013-09-14 LAB — PROTIME-INR
INR: 0.9 (ref 0.00–1.49)
Prothrombin Time: 12 seconds (ref 11.6–15.2)

## 2013-09-14 LAB — URINE MICROSCOPIC-ADD ON

## 2013-09-14 NOTE — Pre-Procedure Instructions (Signed)
Abdelmageed Brekke  09/14/2013   Your procedure is scheduled on: Tuesday 09/20/13   Report to [place]  2 * 3 at  1030 AM.  Call this number if you have problems the morning of surgery: [phone]   Remember:   Do not eat food or drink liquids after midnight.   Take these medicines the morning of surgery with A SIP OF WATER:  OMEPRAZOLE (PRILOSEC)                                                STOP IBUPROFEN, ASPIRIN, COUMADIN, PLAVIX, EFFIENT, HERBAL MEDICINES   Do not wear jewelry, make-up or nail polish.  Do not wear lotions, powders, or perfumes. You may wear deodorant.  Do not shave 48 hours prior to surgery. Men may shave face and neck.  Do not bring valuables to the hospital.  [facility] is not responsible                  for any belongings or valuables.               Contacts, dentures or bridgework may not be worn into surgery.  Leave suitcase in the car. After surgery it may be brought to your room.  For patients admitted to the hospital, discharge time is determined by your                treatment team.               Patients discharged the day of surgery will not be allowed to drive  home.  Name and phone number of your driver:   Special Instructions:  Special Instructions: [facility] - Preparing for Surgery  Before surgery, you can play an important role.  Because skin is not sterile, your skin needs to be as free of germs as possible.  You can reduce the number of germs on you skin by washing with CHG (chlorahexidine gluconate) soap before surgery.  CHG is an antiseptic cleaner which kills germs and bonds with the skin to continue killing germs even after washing.  Please DO NOT use if you have an allergy to CHG or antibacterial soaps.  If your skin becomes reddened/irritated stop using the CHG and inform your nurse when you arrive at Short Stay.  Do not shave (including legs and underarms) for at least 48 hours prior to the first CHG  shower.  You may shave your face.  Please follow these instructions carefully:   1.  Shower with CHG Soap the night before surgery and the morning of Surgery.  2.  If you choose to wash your hair, wash your hair first as usual with your normal shampoo.  3.  After you shampoo, rinse your hair and body thoroughly to remove the Shampoo.  4.  Use CHG as you would any other liquid soap. You can apply chg directly to the skin and wash gently with scrungie or a clean washcloth.  5.  Apply the CHG Soap to your body ONLY FROM THE NECK DOWN.  Do not use on open wounds or open sores.  Avoid contact with your eyes, ears, mouth and genitals (private parts).  Wash genitals (private parts with your normal soap.  6.  Wash thoroughly, paying special attention to the area where your surgery will be performed.  7.  Thoroughly rinse your body with warm water from the neck down.  8.  DO NOT shower/wash with your normal soap after using and rinsing off the CHG Soap.  9.  Pat yourself dry with a clean towel.            10.  Wear clean pajamas.            11.  Place clean sheets on your bed the night of your first shower and do not sleep with pets.  Day of Surgery  Do not apply any lotions/deodorants the morning of surgery.  Please wear clean clothes to the hospital/surgery center.   Please read over the following fact sheets that you were given: Pain Booklet, Coughing and Deep Breathing and Surgical Site Infection Prevention

## 2013-09-19 MED ORDER — CIPROFLOXACIN IN D5W 400 MG/200ML IV SOLN
400.0000 mg | INTRAVENOUS | Status: AC
  Administered 2013-09-20: 400 mg via INTRAVENOUS
  Filled 2013-09-19: qty 200

## 2013-09-20 ENCOUNTER — Ambulatory Visit (HOSPITAL_COMMUNITY): Payer: BC Managed Care – PPO | Admitting: Certified Registered Nurse Anesthetist

## 2013-09-20 ENCOUNTER — Encounter (HOSPITAL_COMMUNITY)
Admission: RE | Disposition: A | Discharge status: Home / Self Care | Payer: Self-pay | Source: Ambulatory Visit | Attending: General Surgery

## 2013-09-20 ENCOUNTER — Encounter (HOSPITAL_COMMUNITY): Payer: Self-pay | Admitting: Certified Registered Nurse Anesthetist

## 2013-09-20 ENCOUNTER — Encounter (HOSPITAL_COMMUNITY): Payer: BC Managed Care – PPO | Admitting: Certified Registered Nurse Anesthetist

## 2013-09-20 ENCOUNTER — Ambulatory Visit (HOSPITAL_COMMUNITY)
Admission: RE | Admit: 2013-09-20 | Discharge: 2013-09-20 | Disposition: A | Discharge status: Home / Self Care | Payer: BC Managed Care – PPO | Source: Ambulatory Visit | Attending: General Surgery | Admitting: General Surgery

## 2013-09-20 DIAGNOSIS — K811 Chronic cholecystitis: Secondary | ICD-10-CM

## 2013-09-20 DIAGNOSIS — D3A010 Benign carcinoid tumor of the duodenum: Secondary | ICD-10-CM | POA: Insufficient documentation

## 2013-09-20 DIAGNOSIS — E785 Hyperlipidemia, unspecified: Secondary | ICD-10-CM | POA: Insufficient documentation

## 2013-09-20 DIAGNOSIS — Z87891 Personal history of nicotine dependence: Secondary | ICD-10-CM | POA: Insufficient documentation

## 2013-09-20 DIAGNOSIS — K828 Other specified diseases of gallbladder: Secondary | ICD-10-CM | POA: Insufficient documentation

## 2013-09-20 DIAGNOSIS — K219 Gastro-esophageal reflux disease without esophagitis: Secondary | ICD-10-CM | POA: Insufficient documentation

## 2013-09-20 HISTORY — PX: CHOLECYSTECTOMY: SHX55

## 2013-09-20 SURGERY — LAPAROSCOPIC CHOLECYSTECTOMY
Anesthesia: Regional

## 2013-09-20 MED ORDER — CHLORHEXIDINE GLUCONATE 4 % EX LIQD
1.0000 "application " | Freq: Once | CUTANEOUS | Status: DC
  Filled 2013-09-20: qty 15

## 2013-09-20 MED ORDER — 0.9 % SODIUM CHLORIDE (POUR BTL) OPTIME
TOPICAL | Status: DC | PRN
  Administered 2013-09-20: 1000 mL

## 2013-09-20 MED ORDER — PHENYLEPHRINE HCL 10 MG/ML IJ SOLN
INTRAMUSCULAR | Status: DC | PRN
  Administered 2013-09-20: 160 ug via INTRAVENOUS
  Administered 2013-09-20 (×2): 120 ug via INTRAVENOUS

## 2013-09-20 MED ORDER — HYDROMORPHONE HCL PF 1 MG/ML IJ SOLN
INTRAMUSCULAR | Status: AC
  Filled 2013-09-20: qty 1

## 2013-09-20 MED ORDER — GLYCOPYRROLATE 0.2 MG/ML IJ SOLN
INTRAMUSCULAR | Status: AC
  Filled 2013-09-20: qty 3

## 2013-09-20 MED ORDER — LACTATED RINGERS IV SOLN: INTRAVENOUS | Status: DC

## 2013-09-20 MED ORDER — SODIUM CHLORIDE 0.9 % IJ SOLN: 3.0000 mL | INTRAMUSCULAR | Status: DC | PRN

## 2013-09-20 MED ORDER — FENTANYL CITRATE 0.05 MG/ML IJ SOLN
INTRAMUSCULAR | Status: DC | PRN
  Administered 2013-09-20: 150 ug via INTRAVENOUS
  Administered 2013-09-20: 50 ug via INTRAVENOUS

## 2013-09-20 MED ORDER — ONDANSETRON HCL 4 MG/2ML IJ SOLN
INTRAMUSCULAR | Status: DC | PRN
  Administered 2013-09-20: 4 mg via INTRAVENOUS

## 2013-09-20 MED ORDER — ROCURONIUM BROMIDE 100 MG/10ML IV SOLN
INTRAVENOUS | Status: DC | PRN
  Administered 2013-09-20: 40 mg via INTRAVENOUS

## 2013-09-20 MED ORDER — MIDAZOLAM HCL 2 MG/2ML IJ SOLN
INTRAMUSCULAR | Status: AC
  Filled 2013-09-20: qty 2

## 2013-09-20 MED ORDER — DEXAMETHASONE SODIUM PHOSPHATE 4 MG/ML IJ SOLN
INTRAMUSCULAR | Status: AC
  Filled 2013-09-20: qty 2

## 2013-09-20 MED ORDER — NEOSTIGMINE METHYLSULFATE 1 MG/ML IJ SOLN
INTRAMUSCULAR | Status: DC | PRN
  Administered 2013-09-20: 5 mg via INTRAVENOUS

## 2013-09-20 MED ORDER — MEPERIDINE HCL 25 MG/ML IJ SOLN: 6.2500 mg | INTRAMUSCULAR | Status: DC | PRN

## 2013-09-20 MED ORDER — PROPOFOL 10 MG/ML IV BOLUS
INTRAVENOUS | Status: DC | PRN
  Administered 2013-09-20: 180 mg via INTRAVENOUS

## 2013-09-20 MED ORDER — LIDOCAINE HCL (CARDIAC) 20 MG/ML IV SOLN
INTRAVENOUS | Status: AC
  Filled 2013-09-20: qty 5

## 2013-09-20 MED ORDER — GLYCOPYRROLATE 0.2 MG/ML IJ SOLN
INTRAMUSCULAR | Status: DC | PRN
  Administered 2013-09-20: 0.6 mg via INTRAVENOUS

## 2013-09-20 MED ORDER — OXYCODONE HCL 5 MG PO TABS: 5.0000 mg | ORAL_TABLET | ORAL | Status: DC | PRN

## 2013-09-20 MED ORDER — PROMETHAZINE HCL 25 MG/ML IJ SOLN: 6.2500 mg | INTRAMUSCULAR | Status: DC | PRN

## 2013-09-20 MED ORDER — ROCURONIUM BROMIDE 50 MG/5ML IV SOLN
INTRAVENOUS | Status: AC
  Filled 2013-09-20: qty 1

## 2013-09-20 MED ORDER — ONDANSETRON HCL 4 MG/2ML IJ SOLN
INTRAMUSCULAR | Status: AC
  Filled 2013-09-20: qty 2

## 2013-09-20 MED ORDER — FENTANYL CITRATE 0.05 MG/ML IJ SOLN
INTRAMUSCULAR | Status: AC
  Filled 2013-09-20: qty 5

## 2013-09-20 MED ORDER — LIDOCAINE HCL (CARDIAC) 20 MG/ML IV SOLN
INTRAVENOUS | Status: DC | PRN
  Administered 2013-09-20: 100 mg via INTRAVENOUS

## 2013-09-20 MED ORDER — ACETAMINOPHEN 325 MG PO TABS
650.0000 mg | ORAL_TABLET | ORAL | Status: DC | PRN
  Filled 2013-09-20: qty 2

## 2013-09-20 MED ORDER — ONDANSETRON HCL 4 MG/2ML IJ SOLN
4.0000 mg | Freq: Four times a day (QID) | INTRAMUSCULAR | Status: DC | PRN
  Filled 2013-09-20: qty 2

## 2013-09-20 MED ORDER — MIDAZOLAM HCL 5 MG/5ML IJ SOLN
INTRAMUSCULAR | Status: DC | PRN
  Administered 2013-09-20: 2 mg via INTRAVENOUS

## 2013-09-20 MED ORDER — SODIUM CHLORIDE 0.9 % IV SOLN: 250.0000 mL | INTRAVENOUS | Status: DC | PRN

## 2013-09-20 MED ORDER — HYDROMORPHONE HCL PF 1 MG/ML IJ SOLN: 0.2500 mg | INTRAMUSCULAR | Status: DC | PRN

## 2013-09-20 MED ORDER — ARTIFICIAL TEARS OP OINT
TOPICAL_OINTMENT | OPHTHALMIC | Status: AC
  Filled 2013-09-20: qty 3.5

## 2013-09-20 MED ORDER — DEXAMETHASONE SODIUM PHOSPHATE 4 MG/ML IJ SOLN
INTRAMUSCULAR | Status: DC | PRN
  Administered 2013-09-20: 8 mg via INTRAVENOUS

## 2013-09-20 MED ORDER — PROPOFOL 10 MG/ML IV BOLUS
INTRAVENOUS | Status: AC
  Filled 2013-09-20: qty 20

## 2013-09-20 MED ORDER — ARTIFICIAL TEARS OP OINT
TOPICAL_OINTMENT | OPHTHALMIC | Status: DC | PRN
  Administered 2013-09-20: 1 via OPHTHALMIC

## 2013-09-20 MED ORDER — EPHEDRINE SULFATE 50 MG/ML IJ SOLN
INTRAMUSCULAR | Status: DC | PRN
  Administered 2013-09-20 (×2): 5 mg via INTRAVENOUS

## 2013-09-20 MED ORDER — HYDROMORPHONE HCL PF 1 MG/ML IJ SOLN
0.2500 mg | INTRAMUSCULAR | Status: DC | PRN
  Administered 2013-09-20 (×4): 0.5 mg via INTRAVENOUS

## 2013-09-20 MED ORDER — LIDOCAINE HCL 1 % IJ SOLN
INTRAMUSCULAR | Status: DC | PRN
  Administered 2013-09-20: 12:00:00 via SUBCUTANEOUS

## 2013-09-20 MED ORDER — SODIUM CHLORIDE 0.9 % IJ SOLN: 3.0000 mL | Freq: Two times a day (BID) | INTRAMUSCULAR | Status: DC

## 2013-09-20 MED ORDER — LACTATED RINGERS IV SOLN
INTRAVENOUS | Status: DC
  Administered 2013-09-20: 50 mL/h via INTRAVENOUS

## 2013-09-20 MED ORDER — NEOSTIGMINE METHYLSULFATE 1 MG/ML IJ SOLN
INTRAMUSCULAR | Status: AC
  Filled 2013-09-20: qty 10

## 2013-09-20 MED ORDER — ACETAMINOPHEN 650 MG RE SUPP
650.0000 mg | RECTAL | Status: DC | PRN
  Filled 2013-09-20: qty 1

## 2013-09-20 MED ORDER — SODIUM CHLORIDE 0.9 % IR SOLN
Status: DC | PRN
  Administered 2013-09-20: 1000 mL

## 2013-09-20 MED ORDER — LACTATED RINGERS IV SOLN
INTRAVENOUS | Status: DC | PRN
  Administered 2013-09-20: 11:00:00 via INTRAVENOUS

## 2013-09-20 MED ORDER — OXYCODONE-ACETAMINOPHEN 5-325 MG PO TABS: 1.0000 | ORAL_TABLET | ORAL | Status: DC | PRN

## 2013-09-20 MED ORDER — PHENYLEPHRINE 40 MCG/ML (10ML) SYRINGE FOR IV PUSH (FOR BLOOD PRESSURE SUPPORT)
PREFILLED_SYRINGE | INTRAVENOUS | Status: AC
  Filled 2013-09-20: qty 10

## 2013-09-20 SURGICAL SUPPLY — 47 items
ADH SKN CLS APL DERMABOND .7 (GAUZE/BANDAGES/DRESSINGS) ×1
APPLIER CLIP ROT 10 11.4 M/L (STAPLE) ×3
APR CLP MED LRG 11.4X10 (STAPLE) ×1
BAG SPEC RTRVL LRG 6X4 10 (ENDOMECHANICALS) ×1
BLADE SURG ROTATE 9660 (MISCELLANEOUS) IMPLANT
CANISTER SUCTION 2500CC (MISCELLANEOUS) ×3 IMPLANT
CHLORAPREP W/TINT 26ML (MISCELLANEOUS) ×3 IMPLANT
CLIP APPLIE ROT 10 11.4 M/L (STAPLE) ×1 IMPLANT
COVER MAYO STAND STRL (DRAPES) IMPLANT
COVER SURGICAL LIGHT HANDLE (MISCELLANEOUS) ×3 IMPLANT
DECANTER SPIKE VIAL GLASS SM (MISCELLANEOUS) ×6 IMPLANT
DERMABOND ADVANCED (GAUZE/BANDAGES/DRESSINGS) ×2
DERMABOND ADVANCED .7 DNX12 (GAUZE/BANDAGES/DRESSINGS) ×1 IMPLANT
DRAPE C-ARM 42X72 X-RAY (DRAPES) IMPLANT
DRAPE UTILITY 15X26 W/TAPE STR (DRAPE) ×6 IMPLANT
DRAPE WARM FLUID 44X44 (DRAPE) ×3 IMPLANT
ELECT REM PT RETURN 9FT ADLT (ELECTROSURGICAL) ×3
ELECTRODE REM PT RTRN 9FT ADLT (ELECTROSURGICAL) ×1 IMPLANT
FILTER SMOKE EVAC LAPAROSHD (FILTER) IMPLANT
GLOVE BIO SURGEON STRL SZ 6 (GLOVE) ×3 IMPLANT
GLOVE BIOGEL PI IND STRL 6.5 (GLOVE) ×1 IMPLANT
GLOVE BIOGEL PI IND STRL 7.0 (GLOVE) IMPLANT
GLOVE BIOGEL PI INDICATOR 6.5 (GLOVE) ×2
GLOVE BIOGEL PI INDICATOR 7.0 (GLOVE) ×4
GLOVE SS BIOGEL STRL SZ 6.5 (GLOVE) IMPLANT
GLOVE SUPERSENSE BIOGEL SZ 6.5 (GLOVE) ×2
GLOVE SURG SS PI 7.0 STRL IVOR (GLOVE) ×2 IMPLANT
GOWN STRL NON-REIN LRG LVL3 (GOWN DISPOSABLE) ×9 IMPLANT
GOWN STRL REUS W/TWL 2XL LVL3 (GOWN DISPOSABLE) ×3 IMPLANT
KIT BASIN OR (CUSTOM PROCEDURE TRAY) ×3 IMPLANT
KIT ROOM TURNOVER OR (KITS) ×3 IMPLANT
NS IRRIG 1000ML POUR BTL (IV SOLUTION) ×3 IMPLANT
PAD ARMBOARD 7.5X6 YLW CONV (MISCELLANEOUS) ×6 IMPLANT
POUCH SPECIMEN RETRIEVAL 10MM (ENDOMECHANICALS) ×3 IMPLANT
SCISSORS LAP 5X35 DISP (ENDOMECHANICALS) ×3 IMPLANT
SET CHOLANGIOGRAPH 5 50 .035 (SET/KITS/TRAYS/PACK) IMPLANT
SET IRRIG TUBING LAPAROSCOPIC (IRRIGATION / IRRIGATOR) ×3 IMPLANT
SLEEVE ENDOPATH XCEL 5M (ENDOMECHANICALS) ×3 IMPLANT
SPECIMEN JAR SMALL (MISCELLANEOUS) ×3 IMPLANT
SUT MNCRL AB 4-0 PS2 18 (SUTURE) ×3 IMPLANT
TOWEL OR 17X24 6PK STRL BLUE (TOWEL DISPOSABLE) ×3 IMPLANT
TOWEL OR 17X26 10 PK STRL BLUE (TOWEL DISPOSABLE) ×3 IMPLANT
TRAY LAPAROSCOPIC (CUSTOM PROCEDURE TRAY) ×3 IMPLANT
TROCAR XCEL BLUNT TIP 100MML (ENDOMECHANICALS) ×3 IMPLANT
TROCAR XCEL NON-BLD 11X100MML (ENDOMECHANICALS) ×3 IMPLANT
TROCAR XCEL NON-BLD 5MMX100MML (ENDOMECHANICALS) ×3 IMPLANT
WATER STERILE IRR 1000ML POUR (IV SOLUTION) IMPLANT

## 2013-09-20 NOTE — Op Note (Signed)
Laparoscopic Cholecystectomy  Indications: This patient presents with biliary dyskinesia and will undergo laparoscopic cholecystectomy.  Pre-operative Diagnosis: chronic cholecystitis and biliary dyskinesia  Post-operative Diagnosis: Same  Surgeon: Stark Klein   Anesthesia: General endotracheal anesthesia and local  ASA Class: 2  Procedure Details  The patient was seen again in the Holding Room. The risks, benefits, complications, treatment options, and expected outcomes were discussed with the patient. The possibilities of  bleeding, recurrent infection, damage to nearby structures, the need for additional procedures, failure to diagnose a condition, the possible need to convert to an open procedure, and creating a complication requiring transfusion or operation were discussed with the patient. The likelihood of improving the patient's symptoms with return to their baseline status is good.    The patient and/or family concurred with the proposed plan, giving informed consent. The site of surgery properly noted. The patient was taken to Operating Room, and the procedure verified as Laparoscopic Cholecystectomy. A Time Out was held and the above information confirmed.  Prior to the induction of general anesthesia, antibiotic prophylaxis was administered. General endotracheal anesthesia was then administered and tolerated well. After the induction, the abdomen was prepped with Chloraprep and draped in the sterile fashion. The patient was positioned in the supine position.  Local anesthetic agent was injected into the skin near the umbilicus and a transverse curvilinear incision made. We dissected down to the abdominal fascia with blunt dissection.  The fascia was incised vertically and we entered the peritoneal cavity bluntly.  A pursestring suture of 0-Vicryl was placed around the fascial opening.  The Hasson cannula was inserted and secured with the stay suture.  Pneumoperitoneum was then  created with CO2 and tolerated well without any adverse changes in the patient's vital signs. An 11-mm port was placed in the subxiphoid position.  Two 5-mm ports were placed in the right upper quadrant. All skin incisions were infiltrated with a local anesthetic agent before making the incision and placing the trocars.   We positioned the patient in reverse Trendelenburg, tilted slightly to the patient's left.  The gallbladder was identified, the fundus grasped and retracted cephalad. Adhesions were lysed bluntly and with the electrocautery where indicated, taking care not to injure any adjacent organs or viscus. The infundibulum was grasped and retracted laterally, exposing the peritoneum overlying the triangle of Calot. This was then divided and exposed in a blunt fashion. A critical view of the cystic duct and cystic artery was obtained.  The cystic duct was clearly identified and bluntly dissected circumferentially. The cystic duct was ligated with a clip distally, 3 clips proximally, and divided. The cystic artery was identified, dissected free, ligated with clips and divided as well.   The gallbladder was dissected from the liver bed in retrograde fashion with the electrocautery. The gallbladder was removed and placed in an Endocatch bag.  The gallbladder and Endocatch bag were then removed through the umbilical port site.  The liver bed was irrigated and inspected. Hemostasis was achieved with the electrocautery. Copious irrigation was utilized and was repeatedly aspirated until clear.    We again inspected the right upper quadrant for hemostasis.  Pneumoperitoneum was released as we removed the trocars.   The pursestring suture was used to close the umbilical fascia.  4-0 Monocryl was used to close the skin.   The skin was cleaned and dry, and Dermabond was applied. The patient was then extubated and brought to the recovery room in stable condition. Instrument, sponge, and needle counts were correct  at closure and at the conclusion of the case.   Findings: ? Tattoo pigment in gallbladder wall.    Estimated Blood Loss: min         Drains: none          Specimens: Gallbladder to pathology       Complications: None; patient tolerated the procedure well.         Disposition: PACU - hemodynamically stable.         Condition: stable

## 2013-09-20 NOTE — Transfer of Care (Signed)
Immediate Anesthesia Transfer of Care Note  Patient: Joel Mason  Procedure(s) Performed: Procedure(s): LAPAROSCOPIC CHOLECYSTECTOMY (N/A)  Patient Location: PACU  Anesthesia Type:General  Level of Consciousness: awake, alert , oriented and patient cooperative  Airway & Oxygen Therapy: Patient Spontanous Breathing and Patient connected to nasal cannula oxygen  Post-op Assessment: Report given to PACU RN, Post -op Vital signs reviewed and stable and Patient moving all extremities X 4  Post vital signs: Reviewed and stable  Complications: No apparent anesthesia complications

## 2013-09-20 NOTE — H&P (Signed)
Chief Complaint   Patient presents with   .  New Evaluation     new gallbladder    HISTORY:  Patient is a 48 year old male who presents with abdominal pain. He has been having pain for around 4-5 years, but his attacks have started to come closer and closer together. They used to be very infrequent, but now occur every 2-4 weeks. They were postprandial in nature. Some of them lasted up to 9 hours. He was not found having gallstones on ultrasound. As part of his workup, he had an endoscopy. 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 He underwent endoscopic ultrasound and repeat biopsy. He had a biopsy of the duodenal bulb that was reactive only, and one in the duodenal bulb that was suspicious for carcinoid. These were not labeled correctly as anterior or posterior. One of them was tattooed, but it is not clear whether it was the one that was reactive or the opposite one. HIDA scan demonstrated biliary dyskinesia.     Past Medical History   Diagnosis  Date   .  GERD (gastroesophageal reflux disease)    .  Goiter      no current treatment for goiter   .  Hyperlipidemia    .  H. pylori infection    .  Neuromuscular disorder     Past Surgical History   Procedure  Laterality  Date   .  Hernia repair   2001   .  Eus  N/A  07/07/2013     Procedure: UPPER ENDOSCOPIC ULTRASOUND (EUS) LINEAR; Surgeon: Milus Banister, MD; Location: [facility]; Service: Endoscopy; Laterality: N/A;    Current Outpatient Prescriptions   Medication  Sig  Dispense  Refill   .  HYDROcodone-acetaminophen (NORCO/VICODIN) 5-325 MG per tablet  Take 1 tablet by mouth every 6 (six) hours as needed for moderate pain.     Marland Kitchen  omeprazole (PRILOSEC) 40 MG capsule  Take 40 mg by mouth daily.      No current facility-administered medications for this visit.    Allergies   Allergen  Reactions   .  Amoxicillin  Anxiety     Insomnia, nervous    Family History   Problem  Relation  Age of Onset   .  Diabetes   Mother     History    Social History   .  Marital Status:  Married     Spouse Name:  N/A     Number of Children:  N/A   .  Years of Education:  N/A    Social History Main Topics   .  Smoking status:  Former Smoker -- 1.00 packs/day for 10 years     Types:  Cigarettes     Quit date:  10/24/2003   .  Smokeless tobacco:  Current User     Types:  Chew   .  Alcohol Use:  No   .  Drug Use:  No   .  Sexual Activity:  None    REVIEW OF SYSTEMS - PERTINENT POSITIVES ONLY:  12 point review of systems negative other than HPI and PMH  EXAM:  Filed Vitals:    07/25/13 1420   BP:  110/60   Pulse:  60   Temp:  98.2 F (36.8 C)   Resp:  14    Filed Weights    07/25/13 1420   Weight:  183 lb 6.4 oz (83.19 kg)    Gen: No acute distress. Well  nourished and well groomed.  Neurological: Alert and oriented to person, place, and time. Coordination normal.  Head: Normocephalic and atraumatic.  Eyes: Conjunctivae are normal. Pupils are equal, round, and reactive to light. No scleral icterus.  Cardiovascular: Normal rate, regular rhythm Respiratory: Effort normal. No respiratory distress.  GI: Soft. Bowel sounds are normal. The abdomen is soft and nontender. There is no rebound and no guarding.  Skin: Skin is warm and dry. No rash noted. No diaphoresis. No erythema. No pallor. No clubbing, cyanosis, or edema.  Psychiatric: Normal mood and affect. Behavior is normal. Judgment and thought content normal.   LABORATORY RESULTS:  Available labs are reviewed  Diagnosis  Surgical [P], duodenal bulb, biopsy  - CARCINOID TUMOR (LOW GRADE NEUROENDOCRINE TUMOR).  Enid Cutter MD  Pathologist, Electronic Signature  (Case signed 06/07/2013)   RADIOLOGY RESULTS:  See E-Chart or I-Site for most recent results. Images and reports are reviewed.  HIDA  IMPRESSION:  Markedly diminished gallbladder ejection fraction. No pain with CCK  Infusion.  Ultrasound  IMPRESSION: No cholelithiasis evident.There  is slight increased  echogenicity in small portions of the gallbladder wall but no  acoustic shadowing is seen posterior to these and no thickening of  the wall is evident. On some images there is possible minimal  comet-tail artifact which may be associated with Rokitansky-Aschoff  sinuses of adenomyomatosis.  Otherwise normal examination. Portions of pancreas were obscured by  bowel gas.    ASSESSMENT AND PLAN:   Chronic cholecystitis  The surgical procedure was described to the patient in detail. The patient was given educational material. I discussed the incision type and location, the location of the gallbladder, the anatomy of the bile ducts and arteries, and the typical progression of surgery. I discussed the possibility of converting to an open operation. I advised of the risks of bleeding, infection, damage to other structures (such as the bile duct, intestine or liver), bile leak, need for other procedures or surgeries, and post op diarrhea/constipation. We discussed the risk of blood clot. We discussed the recovery period and post operative restrictions.   Milus Height MD  Surgical Oncology, [facility]   Visit Diagnoses:  1.  Biliary dyskinesia   2.  Carcinoid tumor of small intestine   Primary Care Physician:  Kennon Portela, MD

## 2013-09-20 NOTE — Preoperative (Signed)
Beta Blockers   Reason not to administer Beta Blockers:Not Applicable 

## 2013-09-20 NOTE — Anesthesia Postprocedure Evaluation (Signed)
  Anesthesia Post-op Note  Patient: Joel Mason  Procedure(s) Performed: Procedure(s): LAPAROSCOPIC CHOLECYSTECTOMY (N/A)  Patient Location: PACU  Anesthesia Type:General  Level of Consciousness: awake  Airway and Oxygen Therapy: Patient Spontanous Breathing  Post-op Pain: mild  Post-op Assessment: Post-op Vital signs reviewed  Post-op Vital Signs: Reviewed  Complications: No apparent anesthesia complications

## 2013-09-20 NOTE — Anesthesia Preprocedure Evaluation (Addendum)
Anesthesia Evaluation  Patient identified by MRN, date of birth, ID band Patient awake    Reviewed: Allergy & Precautions, H&P , NPO status , Patient's Chart, lab work & pertinent test results  History of Anesthesia Complications Negative for: history of anesthetic complications  Airway Mallampati: II TM Distance: >3 FB Neck ROM: Full    Dental  (+) Teeth Intact, Dental Advisory Given   Pulmonary former smoker,  breath sounds clear to auscultation        Cardiovascular Rhythm:Regular Rate:Normal     Neuro/Psych  Headaches,    GI/Hepatic Neg liver ROS, GERD-  ,  Endo/Other  Goiter present  Renal/GU negative Renal ROS     Musculoskeletal   Abdominal   Peds  Hematology   Anesthesia Other Findings   Reproductive/Obstetrics                         Anesthesia Physical Anesthesia Plan  ASA: II  Anesthesia Plan: Regional   Post-op Pain Management:    Induction: Intravenous  Airway Management Planned: Oral ETT  Additional Equipment:   Intra-op Plan:   Post-operative Plan: Extubation in OR  Informed Consent: I have reviewed the patients History and Physical, chart, labs and discussed the procedure including the risks, benefits and alternatives for the proposed anesthesia with the patient or authorized representative who has indicated his/her understanding and acceptance.   Dental advisory given  Plan Discussed with: CRNA, Anesthesiologist and Surgeon  Anesthesia Plan Comments:        Anesthesia Quick Evaluation

## 2013-09-20 NOTE — Discharge Instructions (Signed)
[facility], [place] [phone]  ABDOMINAL SURGERY: POST OP INSTRUCTIONS  Always review your discharge instruction sheet given to you by the facility where your surgery was performed.  IF YOU HAVE DISABILITY OR FAMILY LEAVE FORMS, YOU MUST BRING THEM TO THE OFFICE FOR PROCESSING.  PLEASE DO NOT GIVE THEM TO YOUR DOCTOR.  1. A prescription for pain medication may be given to you upon discharge.  Take your pain medication as prescribed, if needed.  If narcotic pain medicine is not needed, then you may take acetaminophen (Tylenol) or ibuprofen (Advil) as needed. 2. Take your usually prescribed medications unless otherwise directed. 3. If you need a refill on your pain medication, please contact your pharmacy. They will contact our office to request authorization.  Prescriptions will not be filled after 5pm or on week-ends. 4. You should follow a light diet the first few days after arrival home, such as soup and crackers, pudding, etc.unless your doctor has advised otherwise. A high-fiber, low fat diet can be resumed as tolerated.   Be sure to include lots of fluids daily. Most patients will experience some swelling and bruising on the chest and neck area.  Ice packs will help.  Swelling and bruising can take several days to resolve 5. Most patients will experience some swelling and bruising in the area of the incision. Ice pack will help. Swelling and bruising can take several days to resolve..  6. It is common to experience some constipation if taking pain medication after surgery.  Increasing fluid intake and taking a stool softener will usually help or prevent this problem from occurring.  A mild laxative (Milk of Magnesia or Miralax) should be taken according to package directions if there are no bowel movements after 48 hours. 7.  You may have steri-strips (small skin tapes) in place directly over the incision.  These strips should be left on the skin for 10-14 days.  If your  surgeon used skin glue on the incision, you may shower in 48 hours.  The glue will flake off over the next 2-3 weeks.  Any sutures or staples will be removed at the office during your follow-up visit. You may find that a light gauze bandage over your incision may keep your staples from being rubbed or pulled. You may shower and replace the bandage daily. 8. ACTIVITIES:  You may resume regular (light) daily activities beginning the next day--such as daily self-care, walking, climbing stairs--gradually increasing activities as tolerated.  You may have sexual intercourse when it is comfortable.  Refrain from any heavy lifting or straining until approved by your doctor. a. You may drive when you no longer are taking prescription pain medication, you can comfortably wear a seatbelt, and you can safely maneuver your car and apply brakes b. Return to Work: __________3-7 weeks if applicable_________________________ 9. You should see your doctor in the office for a follow-up appointment approximately two weeks after your surgery.  Make sure that you call for this appointment within a day or two after you arrive home to insure a convenient appointment time. OTHER INSTRUCTIONS:  _____________________________________________________________ _____________________________________________________________  WHEN TO CALL YOUR DOCTOR: 1. Fever over 101.0 2. Inability to urinate 3. Nausea and/or vomiting 4. Extreme swelling or bruising 5. Continued bleeding from incision. 6. Increased pain, redness, or drainage from the incision. 7. Difficulty swallowing or breathing 8. Muscle cramping or spasms. 9. Numbness or tingling in hands or feet or around lips.  The clinic staff is  available to answer your questions during regular business hours.  Please don’t hesitate to call and ask to speak to one of the nurses if you have concerns. ° °For further questions, please visit [email or web address] ° ° ° °

## 2013-09-21 ENCOUNTER — Encounter (HOSPITAL_COMMUNITY): Payer: Self-pay | Admitting: General Surgery

## 2013-10-07 ENCOUNTER — Encounter (INDEPENDENT_AMBULATORY_CARE_PROVIDER_SITE_OTHER): Payer: Self-pay

## 2013-10-07 ENCOUNTER — Telehealth (INDEPENDENT_AMBULATORY_CARE_PROVIDER_SITE_OTHER): Payer: Self-pay

## 2013-10-07 NOTE — Telephone Encounter (Signed)
LMOV pt's appt 10/10/13 at 9:45a to 1:45p.m.

## 2013-10-07 NOTE — Telephone Encounter (Signed)
Pt is s/p lap cholecystectomy by Dr. Barry Dienes. He is requesting a  RTW note for 10/08/13.  He says there is no "light duty" at his job.  I informed him there are lifting restrictions after this type of surgery. RTW note was written which included instructions for no lifting over 20lbs for 1-2 weeks after his return.  I asked him to let us know if he had any problems/pain after returning to his job.  He agreed. Pt will p/u letter at front desk today.

## 2013-10-10 ENCOUNTER — Ambulatory Visit (INDEPENDENT_AMBULATORY_CARE_PROVIDER_SITE_OTHER): Payer: BC Managed Care – PPO | Admitting: General Surgery

## 2013-10-10 ENCOUNTER — Encounter (INDEPENDENT_AMBULATORY_CARE_PROVIDER_SITE_OTHER): Payer: BC Managed Care – PPO | Admitting: General Surgery

## 2013-10-10 ENCOUNTER — Encounter (INDEPENDENT_AMBULATORY_CARE_PROVIDER_SITE_OTHER): Payer: Self-pay | Admitting: General Surgery

## 2013-10-10 VITALS — BP 98/70 | HR 70 | Temp 98.4°F | Resp 14 | Ht 74.0 in | Wt 179.4 lb

## 2013-10-10 DIAGNOSIS — D490 Neoplasm of unspecified behavior of digestive system: Secondary | ICD-10-CM

## 2013-10-10 DIAGNOSIS — D3A019 Benign carcinoid tumor of the small intestine, unspecified portion: Secondary | ICD-10-CM

## 2013-10-10 DIAGNOSIS — K828 Other specified diseases of gallbladder: Secondary | ICD-10-CM

## 2013-10-10 NOTE — Assessment & Plan Note (Signed)
No evidence of surgical complications.  Follow up in July to discuss endoscopy.

## 2013-10-10 NOTE — Assessment & Plan Note (Signed)
Repeat endoscopy in June 2015

## 2013-10-10 NOTE — Progress Notes (Signed)
HISTORY: Pt is a 48 yo M around 3 weeks s/p lap chole.  He states that his energy level is nearly back to normal. He is back at work. He has not had any nausea or vomiting. He denies fevers and chills. He has had one or 2 episodes of diarrhea since surgery but has had normal bowel movements otherwise. He has not been taking any pain medication recently.  He also inquires about his duodenal carcinoid. He was found to have a very small duodenal carcinoid but there were issues with the orientation of the specimen. He underwent octreotide scanning that was negative for any residual  carcinoid or widespread carcinoid.      EXAM: General:  Alert and oriented Incision:  Healing well.     PATHOLOGY: Gallbladder - MILD CHRONIC CHOLECYSTITIS. - NO TUMOR SEEN   ASSESSMENT AND PLAN:   Carcinoid tumor of small intestine Repeat endoscopy in June 2015    Biliary dyskinesia No evidence of surgical complications.  Follow up in July to discuss endoscopy.        Milus Height, MD Surgical Oncology, [facility]  GUEST, Veneda Melter, MD Guest, Benn Moulder, MD

## 2013-10-10 NOTE — Patient Instructions (Signed)
Repeat endoscopy in June.  Follow up with me in 4 months.

## 2013-11-25 ENCOUNTER — Encounter: Payer: Self-pay | Admitting: Gastroenterology

## 2013-12-15 ENCOUNTER — Telehealth (INDEPENDENT_AMBULATORY_CARE_PROVIDER_SITE_OTHER): Payer: Self-pay

## 2013-12-15 NOTE — Telephone Encounter (Signed)
Need to schedule f/u endoscopy and office visit for this patient.  Please ask for Joel Mason.

## 2013-12-21 NOTE — Telephone Encounter (Signed)
LMOV. Second attempt to contact pt to set up appointments.

## 2014-01-09 ENCOUNTER — Encounter (INDEPENDENT_AMBULATORY_CARE_PROVIDER_SITE_OTHER): Payer: Self-pay | Admitting: General Surgery

## 2015-02-25 ENCOUNTER — Emergency Department (INDEPENDENT_AMBULATORY_CARE_PROVIDER_SITE_OTHER)
Admission: EM | Admit: 2015-02-25 | Discharge: 2015-02-25 | Disposition: A | Discharge status: Home / Self Care | Payer: Medicaid Other | Source: Home / Self Care | Attending: Family Medicine | Admitting: Family Medicine

## 2015-02-25 ENCOUNTER — Encounter (HOSPITAL_COMMUNITY): Payer: Self-pay | Admitting: Emergency Medicine

## 2015-02-25 DIAGNOSIS — L239 Allergic contact dermatitis, unspecified cause: Secondary | ICD-10-CM

## 2015-02-25 MED ORDER — PREDNISONE 10 MG (21) PO TBPK: ORAL_TABLET | ORAL | Status: AC

## 2015-02-25 NOTE — ED Provider Notes (Signed)
CSN: [id number]     Arrival date & time 02/25/15  1502 History   First MD Initiated Contact with Patient 02/25/15 1512     Chief Complaint  Patient presents with  . Allergic Reaction   (Consider location/radiation/quality/duration/timing/severity/associated sxs/prior Treatment) HPI Comments: Mr. Pennisi is a pleasant 49 yo male who presents with a pruritic rash x 3 days. He was working in the woods a few days prior. The rash is spreading and worsening. No additional symptoms.   Patient is a 49 y.o. male presenting with allergic reaction. The history is provided by the patient.  Allergic Reaction   Past Medical History  Diagnosis Date  . GERD (gastroesophageal reflux disease)   . Goiter     no current treatment for goiter  . Hyperlipidemia   . H. pylori infection   . Neuromuscular disorder   . Headache(784.0)   . 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 Hernia repair  2001  . Eus N/A 07/07/2013    Procedure: UPPER ENDOSCOPIC ULTRASOUND (EUS) LINEAR;  Surgeon: Milus Banister, MD;  Location: [facility];  Service: Endoscopy;  Laterality: N/A;  . Tumor removal      SMALL INT  2014   . Ganglion cyst excision      LEFT HAND  . Cholecystectomy N/A 09/20/2013    Procedure: LAPAROSCOPIC CHOLECYSTECTOMY;  Surgeon: Stark Klein, MD;  Location: [facility];  Service: General;  Laterality: N/A;   Family History  Problem Relation Age of Onset  . Diabetes Mother    History  Substance Use Topics  . Smoking status: Former Smoker -- 1.00 packs/day for 10 years    Types: Cigarettes    Quit date: 10/24/2003  . Smokeless tobacco: Current User    Types: Chew  . Alcohol Use: No    Review of Systems  All other systems reviewed and are negative.   Allergies  Amoxicillin  Home Medications   Prior to Admission medications   Medication Sig Start Date End Date Taking? Authorizing Provider  ibuprofen (ADVIL,MOTRIN) 200 MG  tablet Take 200 mg by mouth every 6 (six) hours as needed for fever, headache or mild pain.    Historical Provider, MD  omeprazole (PRILOSEC) 40 MG capsule Take 1 capsule (40 mg total) by mouth daily. 08/22/13   Stark Klein, MD  predniSONE (STERAPRED UNI-PAK 21 TAB) 10 MG (21) TBPK tablet Take po as directed for 12 days 02/25/15   Bjorn Pippin, PA-C   BP 106/74 mmHg  Pulse 66  Temp(Src) 98.1 F (36.7 C) (Oral)  Resp 16  SpO2 99% Physical Exam  Constitutional: He is oriented to person, place, and time. He appears well-developed and well-nourished. No distress.  Neck: Normal range of motion.  Neurological: He is alert and oriented to person, place, and time.  Skin: Skin is warm and dry. Rash noted. He is not diaphoretic.  Macular papular rash and excoriations to the upper and lower extremities  Psychiatric: His behavior is normal.  Nursing note and vitals reviewed.   ED Course  Procedures (including critical care time) Labs Review Labs Reviewed - No data to display  Imaging Review No results found.   MDM   1. Atopic contact dermatitis   Most likely secondary to working in the woods. Treat with pred pack and supportive care with Benadryl tabs and/or cream. F/U if worsens.    Bjorn Pippin, PA-C 02/25/15  1529 

## 2015-02-25 NOTE — ED Notes (Signed)
Pt states that he started having a red areas that started on his left arm Friday and have spread through out his body, pt states its unknown what caused this

## 2015-02-25 NOTE — Discharge Instructions (Signed)

## 2015-03-03 ENCOUNTER — Emergency Department (HOSPITAL_COMMUNITY)
Admission: EM | Admit: 2015-03-03 | Discharge: 2015-03-03 | Disposition: A | Discharge status: Home / Self Care | Payer: Medicaid Other | Attending: Emergency Medicine | Admitting: Emergency Medicine

## 2015-03-03 ENCOUNTER — Encounter (HOSPITAL_COMMUNITY): Payer: Self-pay

## 2015-03-03 DIAGNOSIS — Z8619 Personal history of other infectious and parasitic diseases: Secondary | ICD-10-CM | POA: Insufficient documentation

## 2015-03-03 DIAGNOSIS — Z880 Allergy status to penicillin: Secondary | ICD-10-CM | POA: Insufficient documentation

## 2015-03-03 DIAGNOSIS — R1013 Epigastric pain: Secondary | ICD-10-CM | POA: Diagnosis present

## 2015-03-03 DIAGNOSIS — K219 Gastro-esophageal reflux disease without esophagitis: Secondary | ICD-10-CM

## 2015-03-03 DIAGNOSIS — Z8669 Personal history of other diseases of the nervous system and sense organs: Secondary | ICD-10-CM | POA: Diagnosis not present

## 2015-03-03 DIAGNOSIS — Z8639 Personal history of other endocrine, nutritional and metabolic disease: Secondary | ICD-10-CM | POA: Insufficient documentation

## 2015-03-03 DIAGNOSIS — Z87891 Personal history of nicotine dependence: Secondary | ICD-10-CM | POA: Diagnosis not present

## 2015-03-03 DIAGNOSIS — Z859 Personal history of malignant neoplasm, unspecified: Secondary | ICD-10-CM | POA: Insufficient documentation

## 2015-03-03 DIAGNOSIS — L259 Unspecified contact dermatitis, unspecified cause: Secondary | ICD-10-CM | POA: Diagnosis not present

## 2015-03-03 MED ORDER — TRIAMCINOLONE ACETONIDE 0.1 % EX CREA: 1.0000 "application " | TOPICAL_CREAM | Freq: Two times a day (BID) | CUTANEOUS | Status: AC

## 2015-03-03 MED ORDER — GI COCKTAIL ~~LOC~~
30.0000 mL | Freq: Once | ORAL | Status: AC
  Administered 2015-03-03: 30 mL via ORAL
  Filled 2015-03-03: qty 30

## 2015-03-03 MED ORDER — FAMOTIDINE 20 MG PO TABS: 20.0000 mg | ORAL_TABLET | Freq: Two times a day (BID) | ORAL | Status: AC

## 2015-03-03 MED ORDER — DEXAMETHASONE SODIUM PHOSPHATE 10 MG/ML IJ SOLN
10.0000 mg | Freq: Once | INTRAMUSCULAR | Status: AC
  Administered 2015-03-03: 10 mg via INTRAMUSCULAR
  Filled 2015-03-03: qty 1

## 2015-03-03 NOTE — Discharge Instructions (Signed)
pepcid and benadryl daily. Kenalog cream to the rash area twice a day. Follow up with primary care doctor.   Poison [employer] ivy is a inflammation of the skin (contact dermatitis) caused by touching the allergens on the leaves of the ivy plant following previous exposure to the plant. The rash usually appears 48 hours after exposure. The rash is usually bumps (papules) or blisters (vesicles) in a linear pattern. Depending on your own sensitivity, the rash may simply cause redness and itching, or it may also progress to blisters which may break open. These must be well cared for to prevent secondary bacterial (germ) infection, followed by scarring. Keep any open areas dry, clean, dressed, and covered with an antibacterial ointment if needed. The eyes may also get puffy. The puffiness is worst in the morning and gets better as the day progresses. This dermatitis usually heals without scarring, within 2 to 3 weeks without treatment. HOME CARE INSTRUCTIONS  Thoroughly wash with soap and water as soon as you have been exposed to poison ivy. You have about one half hour to remove the plant resin before it will cause the rash. This washing will destroy the oil or antigen on the skin that is causing, or will cause, the rash. Be sure to wash under your fingernails as any plant resin there will continue to spread the rash. Do not rub skin vigorously when washing affected area. Poison ivy cannot spread if no oil from the plant remains on your body. A rash that has progressed to weeping sores will not spread the rash unless you have not washed thoroughly. It is also important to wash any clothes you have been wearing as these may carry active allergens. The rash will return if you wear the unwashed clothing, even several days later. Avoidance of the plant in the future is the best measure. Poison ivy plant can be recognized by the number of leaves. Generally, poison ivy has three leaves with flowering branches on a  single stem. Diphenhydramine may be purchased over the counter and used as needed for itching. Do not drive with this medication if it makes you drowsy.Ask your caregiver about medication for children. SEEK MEDICAL CARE IF:  Open sores develop.  Redness spreads beyond area of rash.  You notice purulent (pus-like) discharge.  You have increased pain.  Other signs of infection develop (such as fever). Document Released: 07/11/2000 Document Revised: 10/06/2011 Document Reviewed: 12/22/2008 [facility] Patient Information 2015 [place], [place]. This information is not intended to replace advice given to you by your health care provider. Make sure you discuss any questions you have with your health care provider.

## 2015-03-03 NOTE — ED Provider Notes (Signed)
CSN: [id number]     Arrival date & time 03/03/15  1323 History   First MD Initiated Contact with Patient 03/03/15 1409     Chief Complaint  Patient presents with  . Medication Reaction  . Abdominal Pain  . Poison Ivy     (Consider location/radiation/quality/duration/timing/severity/associated sxs/prior Treatment) HPI Joel Mason is a 50 y.o. male with history of acid reflux, H. pylori, presents to emergency department complaining of rash and abdominal pain. Patient states he developed poison ivy rash 1 week ago. He was seen in urgent care and started on prednisone. Patient states that while taking prednisone his abdominal pain worsened. He states he is having epigastric pain. Pain does not radiate. There is no nausea or vomiting. No fever. There is no doctor her stools or blood in his stool or emesis. Patient states he stopped taking prednisone yesterday and his abdominal pain has seemed to improve however his rash is continually spreading. He reports rash to bilateral arms, left flank, left lower abdomen. Rash is very itchy. No new products. No fever chills malaise.  Past Medical History  Diagnosis Date  . GERD (gastroesophageal reflux disease)   . Goiter     no current treatment for goiter  . Hyperlipidemia   . H. pylori infection   . Neuromuscular disorder   . Headache(784.0)   . 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 Hernia repair  2001  . Eus N/A 07/07/2013    Procedure: UPPER ENDOSCOPIC ULTRASOUND (EUS) LINEAR;  Surgeon: Milus Banister, MD;  Location: [facility];  Service: Endoscopy;  Laterality: N/A;  . Tumor removal      SMALL INT  2014   . Ganglion cyst excision      LEFT HAND  . Cholecystectomy N/A 09/20/2013    Procedure: LAPAROSCOPIC CHOLECYSTECTOMY;  Surgeon: Stark Klein, MD;  Location: [facility];  Service: General;  Laterality: N/A;   Family History  Problem Relation Age of Onset  . Diabetes  Mother    History  Substance Use Topics  . Smoking status: Former Smoker -- 1.00 packs/day for 10 years    Types: Cigarettes    Quit date: 10/24/2003  . Smokeless tobacco: Current User    Types: Chew  . Alcohol Use: No    Review of Systems  Constitutional: Negative for fever and chills.  Respiratory: Negative for cough, chest tightness and shortness of breath.   Cardiovascular: Negative for chest pain, palpitations and leg swelling.  Gastrointestinal: Positive for abdominal pain. Negative for nausea, vomiting, diarrhea and abdominal distention.  Genitourinary: Negative for dysuria, urgency, frequency and hematuria.  Musculoskeletal: Negative for myalgias, arthralgias, neck pain and neck stiffness.  Skin: Positive for rash.  Allergic/Immunologic: Negative for immunocompromised state.  Neurological: Negative for dizziness, weakness, light-headedness, numbness and headaches.  All other systems reviewed and are negative.     Allergies  Amoxicillin  Home Medications   Prior to Admission medications   Medication Sig Start Date End Date Taking? Authorizing Provider  calamine lotion Apply 1 application topically as needed for itching.   Yes Historical Provider, MD  diphenhydrAMINE (BENADRYL) 2 % cream Apply 1 application topically 3 (three) times daily as needed for itching.   Yes Historical Provider, MD  omeprazole (PRILOSEC) 40 MG capsule Take 1 capsule (40 mg total) by mouth daily. Patient not taking: Reported on 03/03/2015 08/22/13   Stark Klein, MD  predniSONE [facility] Eleanora Neighbor  21 TAB) 10 MG (21) TBPK tablet Take po as directed for 12 days Patient not taking: Reported on 03/03/2015 02/25/15   Bjorn Pippin, PA-C   BP 116/75 mmHg  Pulse 66  Temp(Src) 98 F (36.7 C) (Oral)  Resp 19  SpO2 98% Physical Exam  Constitutional: He appears well-developed and well-nourished. No distress.  HENT:  Head: Normocephalic and atraumatic.  Eyes: Conjunctivae are normal.  Neck: Neck  supple.  Cardiovascular: Normal rate, regular rhythm and normal heart sounds.   Pulmonary/Chest: Effort normal. No respiratory distress. He has no wheezes. He has no rales.  Abdominal: Soft. Bowel sounds are normal. He exhibits no distension. There is tenderness. There is no rebound.  Epigastric tenderness, no guarding  Musculoskeletal: He exhibits no edema.  Neurological: He is alert.  Skin: Skin is warm and dry. Rash noted.  Erythematous, papular vesicular rash to bilateral forearms, left flank, left lower abdomen. Rash is itchy. No drainage. No cellulitis.  Nursing note and vitals reviewed.   ED Course  Procedures (including critical care time) Labs Review Labs Reviewed - No data to display  Imaging Review No results found.   EKG Interpretation None      MDM   Final diagnoses:  Contact dermatitis  Gastroesophageal reflux disease, esophagitis presence not specified     patient rash, most likely contact dermatitis, patient reports being around poison ivy. He is unable to tolerate oral prednisone because of prior acid reflux, gastritis, H. pylori. Patient's abdomen is benign. We'll try GI cocktail in the emergency department. We'll give Decadron 10 mg IM. Vitals are normal.   3:04 PM Patient's abdominal pain resolved after GI cocktail. Most likely GERD. Will discharge home with Pepcid, Kenalog cream, Benadryl.  follow up as needed.   Filed Vitals:   03/03/15 1341 03/03/15 1415  BP: 116/75   Pulse: 66   Temp: 98 F (36.7 C)   TempSrc: Oral   Resp: 19   SpO2: 99% 98%     Jeannett Senior, PA-C 03/03/15 1559  Leo Grosser, MD 03/03/15 [phone]

## 2015-03-03 NOTE — ED Notes (Signed)
Pt c/o generalized poison ivy x 9 days and intermittent epigastric pain x 3 days.  Pain score 2/10.  Pt was seen at [facility] and prescribed prednisone.  Pt reports the pain increased as he continued to take prednisone.  Pt stopped taking prednisone yesterday and pain has almost completely resolved, but poison ivy has increased.

## 2015-03-03 NOTE — ED Notes (Signed)
Pt noted with red raised rash to BUE since last week without improvement. Pt reported that he stopped taking Prednisone d/t nausea.

## 2015-11-01 IMAGING — NM NM OCTREOTIDE LOCALIZATION
4 series · 24 of 24 positions shown · non-contrast
Comparison: CT C SPINE W/CM dated 06/16/2008;

CLINICAL DATA: Carcinoid tumor of the duodenum

EXAM:
NUCLEAR MEDICINE OCTREOTIDE (SOMATOSTATIN-RECEPTOR) SCAN
TECHNIQUE: Following intravenous administration of radiopharmaceutical, whole
body images of the head, neck, trunk, and extremities were obtained
on subsequent days.

[Series 1: it indium total body · 4.7mm · 4.75mm/px · 6 of 91 frames shown (1 of 4)]
[frame 8/91]
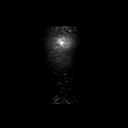
[frame 23/91]
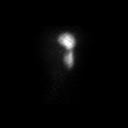
[frame 38/91]
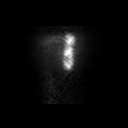
[frame 53/91]
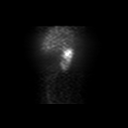
[frame 68/91]
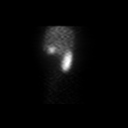
[frame 84/91]
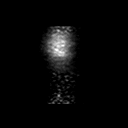

[Series 1: it indium total body · 4.75mm/px · 6 of 64 frames shown (2 of 4)]
[frame 6/64]
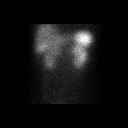
[frame 16/64]
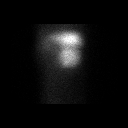
[frame 27/64]
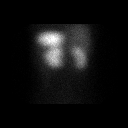
[frame 38/64]
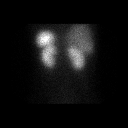
[frame 48/64]
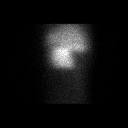
[frame 59/64]
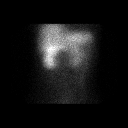

[Series 1: it indium total body · 4.7mm · 4.75mm/px · 6 of 91 frames shown (3 of 4)]
[frame 8/91]
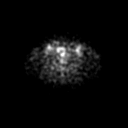
[frame 23/91]
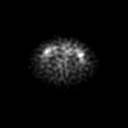
[frame 38/91]
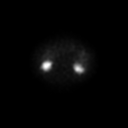
[frame 53/91]
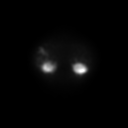
[frame 68/91]
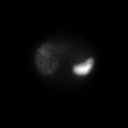
[frame 84/91]
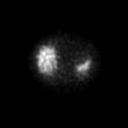

[Series 1: it indium total body · 4.7mm · 4.75mm/px · 6 of 91 frames shown (4 of 4)]
[frame 8/91]
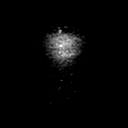
[frame 23/91]
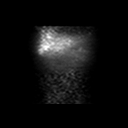
[frame 38/91]
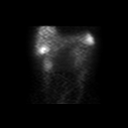
[frame 53/91]
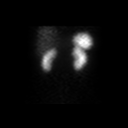
[frame 68/91]
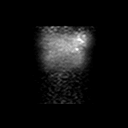
[frame 84/91]
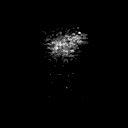

[24 of 24 positions shown; findings below may reference images not displayed]

NM THYROID
SCAN/UPTAKE 24 HR dated 04/25/2008; CT MAXILLOFACIAL W/O CM dated
03/10/2005; CT PELVIS W/CM dated 03/09/2005; DG CHEST 1V PORT dated
03/09/2005

RADIOPHARMACEUTICALS:  6.4 [id number] Octreotide
FINDINGS: On the whole-body planar imaging there is no abnormal uptake to
suggest neuroendocrine tumor. Physiologic uptake noted in the liver,
kidneys, and spleen. Small amount activity in the GU and GI tract.
Diffuse uptake within the thyroid gland can be seen in thyroid
goiter.

SPECT imaging of the abdomen also fails to localize neuroendocrine
tumor. Uptake within the gallbladder is noted on the SPECT imaging
which is a common finding.
IMPRESSION: No scintigraphic evidence of neuroendocrine tumor.

Uptake in the thyroid gland is consistent with thyroid goiter.

## 2018-12-31 ENCOUNTER — Other Ambulatory Visit: Payer: Self-pay

## 2018-12-31 ENCOUNTER — Encounter (HOSPITAL_COMMUNITY): Payer: Self-pay | Admitting: Emergency Medicine

## 2018-12-31 ENCOUNTER — Ambulatory Visit (HOSPITAL_COMMUNITY)
Admission: EM | Admit: 2018-12-31 | Discharge: 2018-12-31 | Disposition: A | Discharge status: Home / Self Care | Payer: Medicaid Other | Attending: Family Medicine | Admitting: Family Medicine

## 2018-12-31 ENCOUNTER — Ambulatory Visit (INDEPENDENT_AMBULATORY_CARE_PROVIDER_SITE_OTHER): Payer: Medicaid Other

## 2018-12-31 DIAGNOSIS — S62112A Displaced fracture of triquetrum [cuneiform] bone, left wrist, initial encounter for closed fracture: Secondary | ICD-10-CM | POA: Diagnosis not present

## 2018-12-31 DIAGNOSIS — S62632A Displaced fracture of distal phalanx of right middle finger, initial encounter for closed fracture: Secondary | ICD-10-CM

## 2018-12-31 DIAGNOSIS — M20011 Mallet finger of right finger(s): Secondary | ICD-10-CM | POA: Diagnosis not present

## 2018-12-31 NOTE — Progress Notes (Signed)
Orthopedic Tech Progress Note Patient Details:  Joel Mason January 19, 1966 [id number] Applied a short arm splint/volar to the Left hand.  The DR wanted me to make the Sims come up to the top of the middle finger. And I did. And put some guaze between the patients fingers.    Ortho Devices Type of Ortho Device: Short arm splint, Arm sling Ortho Device/Splint Location: ULE Ortho Device/Splint Interventions: Adjustment, Application, Ordered   Post Interventions Patient Tolerated: Well Instructions Provided: Care of device, Adjustment of device   Janit Pagan 12/31/2018, 5:34 PM
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
## 2018-12-31 NOTE — Discharge Instructions (Signed)
Keep splint on Follow up with orthopedist next week
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
## 2019-08-09 ENCOUNTER — Ambulatory Visit: Payer: Medicaid Other | Attending: Internal Medicine

## 2019-08-09 DIAGNOSIS — Z20822 Contact with and (suspected) exposure to covid-19: Secondary | ICD-10-CM

## 2019-08-11 ENCOUNTER — Other Ambulatory Visit: Payer: Self-pay

## 2019-08-11 ENCOUNTER — Encounter (HOSPITAL_COMMUNITY): Payer: Self-pay | Admitting: *Deleted

## 2019-08-11 ENCOUNTER — Emergency Department (HOSPITAL_COMMUNITY)
Admission: EM | Admit: 2019-08-11 | Discharge: 2019-08-11 | Disposition: A | Discharge status: Home / Self Care | Payer: Medicaid Other | Attending: Emergency Medicine | Admitting: Emergency Medicine

## 2019-08-11 DIAGNOSIS — R519 Headache, unspecified: Secondary | ICD-10-CM | POA: Diagnosis present

## 2019-08-11 DIAGNOSIS — U071 COVID-19: Secondary | ICD-10-CM | POA: Diagnosis not present

## 2019-08-11 DIAGNOSIS — Z87891 Personal history of nicotine dependence: Secondary | ICD-10-CM | POA: Diagnosis not present

## 2019-08-11 DIAGNOSIS — Z79899 Other long term (current) drug therapy: Secondary | ICD-10-CM | POA: Diagnosis not present

## 2019-08-11 LAB — NOVEL CORONAVIRUS, NAA: SARS-CoV-2, NAA: DETECTED — AB

## 2019-08-11 NOTE — ED Triage Notes (Signed)
Pt complains of dizziness when standing, left sided facial pain for the past few days. He just found out he tested positive for COVID 19 today.

## 2019-08-11 NOTE — ED Provider Notes (Signed)
[facility] DEPT Provider Note   CSN: [id number] Arrival date & time: 08/11/19  1707     History Chief Complaint  Patient presents with  . Facial Pain    Joel Mason is a 54 y.o. male.  HPI Patient reports that 5 days ago he developed general body ache and felt weak.  He was having chills.  Monday he had some cough and was feeling dizzy with standing.  At times a certain position changes of the head things would start to spin a bit.  He was concerned about possible Covid and got tested on Tuesday.  His test returned positive today.  Patient reports that he did notice some discomfort on his left side of the face.  He indicates the submandibular area around the node.  He reports it felt a little uncomfortable over the face by the nose and the glabella yesterday.  He reports symptoms today are much better than they were earlier in the week.  He does not have a generalized headache.  No confusion no incoordination.  He reports that the main reason he came to the emergency department was because his wife was very worried about him having Covid and that from her perspective, everybody with [facility] is very sick.    Past Medical History:  Diagnosis Date  . 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 GERD (gastroesophageal reflux disease)   . Goiter    no current treatment for goiter  . H. pylori infection   . Headache(784.0)   . Hyperlipidemia   . Neuromuscular disorder [facility])     Patient Active Problem List   Diagnosis Date Noted  . Biliary dyskinesia 07/25/2013  . 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 Hx of hyperglycemia 04/24/2012  . 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 Hx of hyperlipidemia 04/24/2012  . Chest pain 04/24/2012    Past Surgical History:  Procedure Laterality Date  . CHOLECYSTECTOMY N/A 09/20/2013   Procedure: LAPAROSCOPIC CHOLECYSTECTOMY;  Surgeon: Stark Klein, MD;  Location: [facility];  Service: General;   Laterality: N/A;  . EUS N/A 07/07/2013   Procedure: UPPER ENDOSCOPIC ULTRASOUND (EUS) LINEAR;  Surgeon: Milus Banister, MD;  [facility];  Service: Endoscopy;  Laterality: N/A;  . GANGLION CYST EXCISION     LEFT HAND  . HERNIA REPAIR  2001  . TUMOR REMOVAL     SMALL INT  2014        Family History  Problem Relation Age of Onset  . Diabetes Mother     Social History   Tobacco Use  . Smoking status: Former Smoker    Packs/day: 1.00    Years: 10.00    Pack years: 10.00    Types: Cigarettes    Quit date: 10/24/2003    Years since quitting: 15.8  . Smokeless tobacco: Current User    Types: Chew  Substance Use Topics  . Alcohol use: No  . Drug use: No    Home Medications Prior to Admission medications   Medication Sig Start Date End Date Taking? Authorizing Provider  calamine lotion Apply 1 application topically as needed for itching.    [provider]  diphenhydrAMINE (BENADRYL) 2 % cream Apply 1 application topically 3 (three) times daily as needed for itching.    [provider]  famotidine (PEPCID) 20 MG tablet Take 1 tablet (20 mg total) by mouth 2 (two) times daily. 03/03/15   Jeannett Senior, PA-C  omeprazole (PRILOSEC) 40 MG capsule Take 1 capsule (40 mg total) by mouth daily. Patient not taking: Reported on 03/03/2015 08/22/13   Stark Klein, MD  predniSONE (STERAPRED UNI-PAK 21 TAB) 10 MG (21) TBPK tablet Take po as directed for 12 days Patient not taking: Reported on 03/03/2015 02/25/15   Bjorn Pippin, PA-C  triamcinolone cream (KENALOG) 0.1 % Apply 1 application topically 2 (two) times daily. 03/03/15   Kirichenko, Lahoma Rocker, PA-C    Allergies    Amoxicillin  Review of Systems   Review of Systems 10 Systems reviewed and are negative for acute change except as noted in the HPI.  Physical Exam Updated Vital Signs BP 116/74 (BP Location: Left Arm)   Pulse 65   Temp 98.3 F (36.8 C) (Oral)   Resp 18   SpO2 100%   Physical  Exam Constitutional:      Comments: Patient is alert and well in appearance. Well Nourished and well-developed.  No respiratory distress.  Oxygen saturation on the monitor is 100% on room air.  HENT:     Head: Normocephalic and atraumatic.     Right Ear: Tympanic membrane normal.     Left Ear: Tympanic membrane normal.     Nose: Nose normal.     Mouth/Throat:     Mouth: Mucous membranes are moist.     Pharynx: Oropharynx is clear.     Comments: Excellent dentition.  Posterior oropharynx is clear.  Patient does not have any facial asymmetry or swelling.  No lesions.  Eyes are normal without injection.  Extraocular motions are normal.  Patient has mild discomfort over the cervical lymph node at the angle of the mandible.  This is not significantly enlarged. Neck:     Comments: Neck is supple.  There is no significantly enlarged lymph nodes but there is some tenderness on the left cervical lymph node.  Neck is supple without meningismus or trismus. Cardiovascular:     Rate and Rhythm: Normal rate and regular rhythm.  Pulmonary:     Effort: Pulmonary effort is normal.     Breath sounds: Normal breath sounds.  Abdominal:     General: There is no distension.     Palpations: Abdomen is soft.     Tenderness: There is no abdominal tenderness. There is no guarding.  Musculoskeletal:        General: No swelling or tenderness. Normal range of motion.     Right lower leg: No edema.     Left lower leg: No edema.  Skin:    General: Skin is warm and dry.  Neurological:     General: No focal deficit present.     Mental Status: He is oriented to person, place, and time.     Cranial Nerves: No cranial nerve deficit.     Sensory: No sensory deficit.     Coordination: Coordination normal.     Comments: Normal cranial nerve exam.  Psychiatric:        Mood and Affect: Mood normal.     ED Results / Procedures / Treatments   Labs (all labs ordered are listed, but only abnormal results are  displayed) Labs Reviewed - No data to display  EKG None  Radiology No results found.  Procedures Procedures (including critical care time)  Medications Ordered in ED Medications - No data to display  ED Course  I have reviewed the triage vital signs and the nursing notes.  Pertinent labs & imaging results that were available during my  care of the patient were reviewed by me and considered in my medical decision making (see chart for details).    MDM Rules/Calculators/A&P                      Patient started with symptoms of Covid 6 days ago.  He tested positive today.  He does actually feel that he is improving.  Oxygen saturation is 100% on lungs are clear.  At this time I do not feel that patient needs further diagnostic evaluation regarding Covid.  He indicated pain on the left side of the face.  He however does not qualify this as being severe and seems to localize it to the cervical lymph nodes which are not significantly enlarged.  The ENT exam is normal.  I do not find any other signs of bacterial infection.  At this time, I feel patient is stable to continue management at home.  He is counseled to return immediately should his symptoms worsen or other symptoms develop. Final Clinical Impression(s) / ED Diagnoses Final diagnoses:  Facial pain  COVID-19    Rx / DC Orders ED Discharge Orders    None       Charlesetta Shanks, MD 08/11/19 1821

## 2019-08-11 NOTE — Discharge Instructions (Addendum)
1.  Follow-up with your family doctor for recheck in the next 3 to 5 days. 2.  Return to the emergency department if you are developing worsening or concerning symptoms. 3.  Take Tylenol for aches or pains.

## 2019-10-27 ENCOUNTER — Ambulatory Visit: Payer: Medicaid Other | Attending: Internal Medicine

## 2019-10-27 DIAGNOSIS — Z23 Encounter for immunization: Secondary | ICD-10-CM

## 2019-10-27 NOTE — Progress Notes (Signed)
   Covid-19 Vaccination Clinic  Name:  Joel Mason    MRN: [id number] DOB: 04/09/1966  10/27/2019  [name] was observed post Covid-19 immunization for 15 minutes without incident. He was provided with Vaccine Information Sheet and instruction to access the V-Safe system.   [name] was instructed to call 911 with any severe reactions post vaccine: Marland Kitchen Difficulty breathing  . Swelling of face and throat  . A fast heartbeat  . A bad rash all over body  . Dizziness and weakness   Immunizations Administered    Name Date Dose VIS Date Route   Pfizer COVID-19 Vaccine 10/27/2019  3:58 PM 0.3 mL 07/08/2019 Intramuscular   Manufacturer: [employer]   Lot: [id number]   [place]: [id number]

## 2019-11-21 ENCOUNTER — Ambulatory Visit: Payer: Medicaid Other | Attending: Internal Medicine

## 2019-11-21 DIAGNOSIS — Z23 Encounter for immunization: Secondary | ICD-10-CM

## 2019-11-21 NOTE — Progress Notes (Signed)
   Covid-19 Vaccination Clinic  Name:  Joel Mason    MRN: [id number] DOB: April 22, 1966  11/21/2019  Joel Mason was observed post Covid-19 immunization for 15 minutes without incident. He was provided with Vaccine Information Sheet and instruction to access the V-Safe system.   Joel Mason was instructed to call 911 with any severe reactions post vaccine: Marland Kitchen Difficulty breathing  . Swelling of face and throat  . A fast heartbeat  . A bad rash all over body  . Dizziness and weakness   Immunizations Administered    Name Date Dose VIS Date Route   Pfizer COVID-19 Vaccine 11/21/2019 12:21 PM 0.3 mL 09/21/2018 Intramuscular   Manufacturer: [employer]   Lot: [id number]   [place]: [id number]
